# Patient Record
Sex: Female | Born: 1935 | Race: White | Hispanic: No | Marital: Married | Smoking: Never smoker
Health system: Southern US, Community
[De-identification: ages and names within clinical notes are randomized; demographics above are authoritative.]

## PROBLEM LIST (undated history)

## (undated) DIAGNOSIS — E039 Hypothyroidism, unspecified: Secondary | ICD-10-CM

## (undated) DIAGNOSIS — R112 Nausea with vomiting, unspecified: Secondary | ICD-10-CM

## (undated) DIAGNOSIS — E785 Hyperlipidemia, unspecified: Secondary | ICD-10-CM

## (undated) DIAGNOSIS — R351 Nocturia: Secondary | ICD-10-CM

## (undated) DIAGNOSIS — M1990 Unspecified osteoarthritis, unspecified site: Secondary | ICD-10-CM

## (undated) DIAGNOSIS — Z9889 Other specified postprocedural states: Secondary | ICD-10-CM

## (undated) DIAGNOSIS — D649 Anemia, unspecified: Secondary | ICD-10-CM

## (undated) DIAGNOSIS — I10 Essential (primary) hypertension: Secondary | ICD-10-CM

## (undated) DIAGNOSIS — G252 Other specified forms of tremor: Secondary | ICD-10-CM

## (undated) HISTORY — PX: TOTAL HIP ARTHROPLASTY: SHX124

## (undated) HISTORY — PX: COLONOSCOPY: SHX174

## (undated) HISTORY — PX: JOINT REPLACEMENT: SHX530

## (undated) HISTORY — PX: KNEE ARTHROSCOPY: SUR90

## (undated) HISTORY — PX: FRACTURE SURGERY: SHX138

---

## 1998-02-19 ENCOUNTER — Other Ambulatory Visit: Admission: RE | Admit: 1998-02-19 | Discharge: 1998-02-19 | Payer: Self-pay | Admitting: Family Medicine

## 1999-01-19 ENCOUNTER — Encounter: Payer: Self-pay | Admitting: Family Medicine

## 1999-01-19 ENCOUNTER — Ambulatory Visit (HOSPITAL_COMMUNITY): Admission: RE | Admit: 1999-01-19 | Discharge: 1999-01-19 | Payer: Self-pay | Admitting: Family Medicine

## 1999-02-26 ENCOUNTER — Other Ambulatory Visit: Admission: RE | Admit: 1999-02-26 | Discharge: 1999-02-26 | Payer: Self-pay | Admitting: Family Medicine

## 2000-06-06 ENCOUNTER — Other Ambulatory Visit: Admission: RE | Admit: 2000-06-06 | Discharge: 2000-06-06 | Payer: Self-pay | Admitting: Family Medicine

## 2000-10-12 ENCOUNTER — Encounter (INDEPENDENT_AMBULATORY_CARE_PROVIDER_SITE_OTHER): Payer: Self-pay

## 2000-10-12 ENCOUNTER — Ambulatory Visit (HOSPITAL_COMMUNITY): Admission: RE | Admit: 2000-10-12 | Discharge: 2000-10-12 | Payer: Self-pay

## 2001-08-22 ENCOUNTER — Other Ambulatory Visit: Admission: RE | Admit: 2001-08-22 | Discharge: 2001-08-22 | Payer: Self-pay | Admitting: Obstetrics and Gynecology

## 2001-10-05 ENCOUNTER — Encounter: Payer: Self-pay | Admitting: Emergency Medicine

## 2001-10-05 ENCOUNTER — Inpatient Hospital Stay (HOSPITAL_COMMUNITY): Admission: EM | Admit: 2001-10-05 | Discharge: 2001-10-09 | Payer: Self-pay | Admitting: Emergency Medicine

## 2001-10-06 ENCOUNTER — Encounter: Payer: Self-pay | Admitting: Orthopedic Surgery

## 2002-11-20 ENCOUNTER — Other Ambulatory Visit: Admission: RE | Admit: 2002-11-20 | Discharge: 2002-11-20 | Payer: Self-pay | Admitting: Obstetrics and Gynecology

## 2003-02-27 ENCOUNTER — Ambulatory Visit (HOSPITAL_COMMUNITY): Admission: RE | Admit: 2003-02-27 | Discharge: 2003-02-27 | Payer: Self-pay | Admitting: Family Medicine

## 2003-11-24 ENCOUNTER — Ambulatory Visit: Payer: Self-pay | Admitting: Family Medicine

## 2004-02-12 ENCOUNTER — Other Ambulatory Visit: Admission: RE | Admit: 2004-02-12 | Discharge: 2004-02-12 | Payer: Self-pay | Admitting: Obstetrics and Gynecology

## 2004-03-23 ENCOUNTER — Ambulatory Visit: Payer: Self-pay | Admitting: Family Medicine

## 2004-07-28 ENCOUNTER — Ambulatory Visit: Payer: Self-pay | Admitting: Family Medicine

## 2004-12-06 ENCOUNTER — Ambulatory Visit: Payer: Self-pay | Admitting: Family Medicine

## 2004-12-13 ENCOUNTER — Ambulatory Visit (HOSPITAL_COMMUNITY): Admission: RE | Admit: 2004-12-13 | Discharge: 2004-12-13 | Payer: Self-pay | Admitting: Orthopedic Surgery

## 2005-01-27 ENCOUNTER — Ambulatory Visit: Payer: Self-pay | Admitting: Family Medicine

## 2005-01-28 ENCOUNTER — Inpatient Hospital Stay (HOSPITAL_COMMUNITY): Admission: EM | Admit: 2005-01-28 | Discharge: 2005-02-01 | Payer: Self-pay | Admitting: *Deleted

## 2005-01-28 ENCOUNTER — Ambulatory Visit: Payer: Self-pay | Admitting: Internal Medicine

## 2005-01-29 ENCOUNTER — Ambulatory Visit: Payer: Self-pay | Admitting: Gastroenterology

## 2005-01-30 ENCOUNTER — Encounter (INDEPENDENT_AMBULATORY_CARE_PROVIDER_SITE_OTHER): Payer: Self-pay | Admitting: Specialist

## 2005-02-07 ENCOUNTER — Ambulatory Visit: Payer: Self-pay | Admitting: Family Medicine

## 2005-02-14 ENCOUNTER — Other Ambulatory Visit: Admission: RE | Admit: 2005-02-14 | Discharge: 2005-02-14 | Payer: Self-pay | Admitting: Obstetrics and Gynecology

## 2005-02-24 ENCOUNTER — Encounter: Admission: RE | Admit: 2005-02-24 | Discharge: 2005-02-24 | Payer: Self-pay | Admitting: Sports Medicine

## 2005-03-03 ENCOUNTER — Ambulatory Visit: Payer: Self-pay | Admitting: Family Medicine

## 2005-03-29 ENCOUNTER — Ambulatory Visit: Payer: Self-pay | Admitting: Family Medicine

## 2005-04-25 ENCOUNTER — Ambulatory Visit: Payer: Self-pay | Admitting: Family Medicine

## 2005-05-24 ENCOUNTER — Ambulatory Visit: Payer: Self-pay | Admitting: Family Medicine

## 2005-05-30 ENCOUNTER — Ambulatory Visit: Payer: Self-pay | Admitting: Family Medicine

## 2005-06-15 ENCOUNTER — Inpatient Hospital Stay (HOSPITAL_COMMUNITY): Admission: RE | Admit: 2005-06-15 | Discharge: 2005-06-18 | Payer: Self-pay | Admitting: Orthopedic Surgery

## 2005-07-11 ENCOUNTER — Encounter: Admission: RE | Admit: 2005-07-11 | Discharge: 2005-08-05 | Payer: Self-pay | Admitting: Orthopedic Surgery

## 2005-07-25 ENCOUNTER — Ambulatory Visit: Payer: Self-pay | Admitting: Family Medicine

## 2005-11-24 ENCOUNTER — Ambulatory Visit: Payer: Self-pay | Admitting: Family Medicine

## 2006-01-27 ENCOUNTER — Ambulatory Visit: Payer: Self-pay | Admitting: Family Medicine

## 2006-05-04 ENCOUNTER — Ambulatory Visit: Payer: Self-pay | Admitting: Family Medicine

## 2007-09-13 ENCOUNTER — Ambulatory Visit (HOSPITAL_COMMUNITY): Admission: RE | Admit: 2007-09-13 | Discharge: 2007-09-13 | Payer: Self-pay | Admitting: Ophthalmology

## 2007-10-04 ENCOUNTER — Ambulatory Visit (HOSPITAL_COMMUNITY): Admission: RE | Admit: 2007-10-04 | Discharge: 2007-10-04 | Payer: Self-pay | Admitting: Ophthalmology

## 2008-10-06 DIAGNOSIS — M810 Age-related osteoporosis without current pathological fracture: Secondary | ICD-10-CM | POA: Insufficient documentation

## 2008-10-06 DIAGNOSIS — K649 Unspecified hemorrhoids: Secondary | ICD-10-CM | POA: Insufficient documentation

## 2008-10-06 DIAGNOSIS — K5730 Diverticulosis of large intestine without perforation or abscess without bleeding: Secondary | ICD-10-CM | POA: Insufficient documentation

## 2008-10-06 DIAGNOSIS — I10 Essential (primary) hypertension: Secondary | ICD-10-CM

## 2008-10-06 DIAGNOSIS — E039 Hypothyroidism, unspecified: Secondary | ICD-10-CM

## 2008-10-06 DIAGNOSIS — Z8719 Personal history of other diseases of the digestive system: Secondary | ICD-10-CM

## 2008-10-06 DIAGNOSIS — R0789 Other chest pain: Secondary | ICD-10-CM

## 2008-10-06 DIAGNOSIS — Z9889 Other specified postprocedural states: Secondary | ICD-10-CM | POA: Insufficient documentation

## 2008-10-06 DIAGNOSIS — R0602 Shortness of breath: Secondary | ICD-10-CM

## 2008-10-06 DIAGNOSIS — E7800 Pure hypercholesterolemia, unspecified: Secondary | ICD-10-CM

## 2008-10-06 DIAGNOSIS — F329 Major depressive disorder, single episode, unspecified: Secondary | ICD-10-CM

## 2008-10-07 DIAGNOSIS — Z860100 Personal history of colon polyps, unspecified: Secondary | ICD-10-CM | POA: Insufficient documentation

## 2009-01-06 ENCOUNTER — Encounter: Payer: Self-pay | Admitting: Family Medicine

## 2009-01-06 ENCOUNTER — Ambulatory Visit (HOSPITAL_COMMUNITY): Admission: RE | Admit: 2009-01-06 | Discharge: 2009-01-06 | Payer: Self-pay | Admitting: Family Medicine

## 2009-01-28 ENCOUNTER — Ambulatory Visit (HOSPITAL_COMMUNITY): Admission: RE | Admit: 2009-01-28 | Discharge: 2009-01-28 | Payer: Self-pay | Admitting: Family Medicine

## 2010-01-27 ENCOUNTER — Encounter: Payer: Self-pay | Admitting: Gastroenterology

## 2010-02-07 ENCOUNTER — Encounter: Payer: Self-pay | Admitting: Family Medicine

## 2010-02-18 NOTE — Letter (Signed)
Summary: Colonoscopy Letter  [facility]  [address] [place], [place] [address]   Phone: [phone]  Fax: [phone]      January 27, 2010 MRN: [id number]   [facility] [address] [place], [place]  [address]   Dear Ms. Kaczorowski,   According to your medical record, it is time for you to schedule a Colonoscopy. The American Cancer Society recommends this procedure as a method to detect early colon cancer. Patients with a family history of colon cancer, or a personal history of colon polyps or inflammatory bowel disease are at increased risk.  This letter has been generated based on the recommendations made at the time of your procedure. If you feel that in your particular situation this may no longer apply, please contact our office.  Please call our office at [phone] to schedule this appointment or to update your records at your earliest convenience.  Thank you for cooperating with [place] to provide you with the very best care possible.   Sincerely,  Judie Petit T. Russella Dar, M.D.  [facility] Gastroenterology Division [phone]

## 2010-06-04 NOTE — Op Note (Signed)
NAME:  [name], [name]                   ACCOUNT NO.:  [id number]   MEDICAL RECORD NO.:  [id number]                   PATIENT TYPE:  INP   LOCATION:  2550                                 FACILITY:  [facility]   PHYSICIAN:  Kennieth Rad, M.D.              DATE OF BIRTH:  09-18-1933   DATE OF PROCEDURE:  10/05/2001  DATE OF DISCHARGE:                                 OPERATIVE REPORT   PREOPERATIVE DIAGNOSES:  Fractured left tibial shaft and fibula.  Also,  fractured distal tibia.   POSTOPERATIVE DIAGNOSES:  Fractured left tibial shaft and fibula.  Also,  fractured distal tibia.   ANESTHESIA:  General.   PROCEDURE PERFORMED:  Open reduction, internal fixation, with intramedullary  rodding, left tibia, and application of short-leg cast.   PROCEDURE:  The patient was taken to the operating room and given adequate  premedication and given general anesthesia and intubated.  The left lower  leg was prepped with Duraprep and draped in a sterile manner.  Tourniquet  and Bovie were used for hemostasis.  The C-arm was used to visualize guide  wire placement and reduction.  The patient had anterior midline incision  over the left patella through the skin and subcutaneous tissue.  A  paramedian incision was made into the capsule with the knee in the flexed  position.  An awl was used to initiate reaming down the tibial shaft,  followed by hand reamer.  A guide wire was placed down across the fracture  site to the distal tibia.  The incision was allowed with the use of the C-  arm.  Measurement was then taken, which was found to be 36 mm for the rod.  Reaming was done up to 10.5 mm.  A 10 mm x 36 mm rod was then placed down  across the fracture site, holding it in anatomic position.  After placement  of the rod, two crossfixing proximal screws were placed, and one distal  crossfixing screw was then placed.  This held the fracture site in a stable  position and anatomic position.  The  distal tibial fracture, the plafond,  was nondisplaced and would be treated with cast.  Copious irrigation with  antibiotic solution was done.  The capsule was closed with 0 Vicryl.  2-0  was used for the subcutaneous.  Skin staples were used for the skin.  A  compressive bulky dressing was applied, followed by a short leg cast.  The  patient tolerated the procedure well and went to the recovery room in stable  and satisfactory condition.                                               Kennieth Rad, M.D.    AFC/MEDQ  D:  10/06/2001  T:  10/08/2001  Job:  [id number]  

## 2010-06-04 NOTE — H&P (Signed)
   NAME:  [name], [name]                   ACCOUNT NO.:  [id number]   MEDICAL RECORD NO.:  [id number]                   PATIENT TYPE:  INP   LOCATION:  2550                                 FACILITY:  [facility]   PHYSICIAN:  Kennieth Rad, M.D.              DATE OF BIRTH:  12-10-1933   DATE OF ADMISSION:  10/05/2001  DATE OF DISCHARGE:                                HISTORY & PHYSICAL   CHIEF COMPLAINT:  Painful deformed left lower leg.   HISTORY OF PRESENT ILLNESS:  This is a 77 year old female who states that  she was out in her yard and slipped on a wet surface and lost her footing.  The patient states that her left lower leg twisted and she fell.  She had to  drag herself back to the house and upstairs.  The patient denies any other  injuries or loss of consciousness.  The patient was subsequently brought to  [facility] Emergency Room for treatment.   PAST MEDICAL HISTORY:  Hypothyroidism, hypercholesterolemia, dilatations and  curettages in the past.   ALLERGIES:  NONE KNOWN.   MEDICATIONS:  1. Synthroid 0.5 milligrams.  2. Metoprolol.  3. Fosamax.   HABITS:  None.   FAMILY HISTORY:  No history of high blood pressure or diabetes.   SOCIAL HISTORY:  The patient lives with her husband and her mother.  The  patient has two sons and one daughter.   PHYSICAL EXAMINATION:  VITAL SIGNS:  Temperature 99.4, pulse 66, respiration  16, blood pressure 136/66.  Height 5'8, weight 180.  HEAD, EYES, EARS, NOSE, THROAT:  Normocephalic.  Sclera clear.  NECK:  Supple.  CHEST:  Clear.  CARDIAC:  S1 and S2 regular.  EXTREMITIES:  Left lower leg swollen, slightly angulated.  Nail beds pink  and blanched.  Dorsalis pedis is palpable.   DIAGNOSTIC STUDIES:  X-rays reveal shallow mid-shaft tibial fracture,  oblique fibula fracture, and posterior tibial plafond fracture of left lower  leg.   IMPRESSION:  1. Tibia and fibula fracture mid-shaft.  2.     Posterior tibial and distal  tibial plafond fracture of left lower leg.  3. History of hypothyroidism.  4. Hypercholesterolemia.                                               Kennieth Rad, M.D.    AFC/MEDQ  D:  10/06/2001  T:  10/09/2001  Job:  [id number]

## 2010-06-04 NOTE — Discharge Summary (Signed)
NAME:  [name], [name] NO.:  [id number]   MEDICAL RECORD NO.:  [id number]          PATIENT TYPE:  INP   LOCATION:  5041                         FACILITY:  [facility]   PHYSICIAN:  Loreta Ave, M.D. DATE OF BIRTH:  1933/12/23   DATE OF ADMISSION:  06/15/2005  DATE OF DISCHARGE:  06/18/2005                                 DISCHARGE SUMMARY   FINAL DIAGNOSES:  1.  Status post left total hip replacement for end-stage degenerative joint      disease/avascular necrosis.  2.  Hypothyroidism.  3.  Hyperlipidemia.  4.  Esophageal reflux.   HISTORY OF PRESENT ILLNESS:  A 77 year old female with history of end-stage  DJD/AVN, left hip, with chronic pain, who presented to our office for a  preoperative evaluation.  She had progressive worsening pain without  response with conservative treatment.  Significant decrease in her daily  activities due to the ongoing complaint.   HOSPITAL COURSE:  On Jun 15, 2005, the patient was taken to the [facility]  OR and a left total hip replacement procedure was performed.  Surgeon Loreta Ave, M.D. and assistant Dimple Casey, P.A.C.  Anesthesia general.  No  specimens.  Estimated blood loss 300 mL.  There were no surgical or  anesthesia complications, and the patient was transferred to recovery in  stable condition.  Started on pharmacy protocol Coumadin postoperatively.  On Jun 16, 2005, the patient was doing well with good pain control.  Temperature 100.7, pulse 91, respirations 16, blood pressure 111/65.  Hemoglobin 10.5.  Sodium 137, potassium 3.9, chloride 108, CO2 of 25, BUN 9,  creatinine 0.9, glucose 151.  Evaluated by PT.  On June 17, 2005, the patient  was doing well with good pain control.  Temperature 100.9, pulse 102,  respirations 20, blood pressure 145/71.  Hemoglobin 10.4, glucose 159, INR  1.5.  The wound looked good, staples intact.  No signs of infection.  Calf  nontender.  Neurovascularly intact.  The patient  continues to progress well  with therapy.  Discontinued PCA and Foley, O2.  Heplocked IV.  On June 18, 2005, the patient was doing very well.  Good hall ambulation.  Ready to go  home.  Vital signs stable, afebrile.  Hemoglobin 10.6, glucose 156, INR of  1.8.  The wound looked good, staples intact.  No signs of infection.  Neurovascularly intact distally.  No bowel movement yet.  She was given a  Dulcolax suppository.   DISCHARGE MEDICATIONS:  1.  Percocet 5/325, 1-2 tablets p.o. q.4-6h. p.r.n. for pain.  2.  Robaxin 500 mg one tablet p.o. q.6h. p.r.n. for spasms.  3.  Coumadin x4 weeks postoperatively for DVT prophylaxis.  4.  Resume previous home medications.   CONDITION:  Good and stable.   DISPOSITION:  Discharged home.   INSTRUCTIONS:  The patient will work with home health PT and OT.  Home  health R.N. to monitor Coumadin dose and PT INRs x4 weeks postoperatively.  Dressing changes p.r.n.  Call the office 2 weeks postoperatively for  recheck.  Call sooner for any problems or concerns.  ______________________________  Dimple Casey, P.A.C.      Loreta Ave, M.D.  Electronically Signed    JM/MEDQ  D:  07/06/2005  T:  07/06/2005  Job:  [id number]

## 2010-06-04 NOTE — Op Note (Signed)
[facility]  Patient:    [name], [name] Visit Number: [id number] MRN: [id number]          Service Type: DSU Location: [facility] Attending Physician:  Sharon Mt Dictated by:   Rande Brunt. Eda Paschal, M.D. Proc. Date: 10/12/00 Admit Date:  10/12/2000                             Operative Report  PREOPERATIVE DIAGNOSIS:       Postmenopausal bleeding with abnormal sonohystogram.  POSTOPERATIVE DIAGNOSIS:      Postmenopausal bleeding with abnormal sonohystogram.  OPERATION:                    Hysteroscopy, dilation and curettage.  SURGEON:                      Daniel L. Eda Paschal, M.D.  ANESTHESIA:                   IV sedation and paracervical block.  INDICATIONS:                  The patient is a 77 year old gravida 3, para 3, AB0 who presented to the office in August with a history of postmenopausal bleeding on Prempro.  She stopped the Prempro.  Sonohystogram was done in the office.  It revealed a normal cavity in terms of endometrial stripe with normal ovaries but, on sonohystogram, there was a significant irregularity of the posterior wall of the uterus noted.  As a result of this, she enters the hospital for hysteroscopy, endometrial sampling and excision of whatever the irregularity is.  FINDINGS:                     External vaginal exam was within normal limits. The cervix was clean. The uterus was midposition, normal size and shape with first-degree  uterine descensus.  Adnexa were not palpable.  At the time of hysteroscopy, the patient had an irregular buildup of endometrium on the posterior wall.  It certainly could have been compatible with a small polyp of about 0.75 cm, but it also could have been just an irregular buildup of endometrium from her Prempro.  The top of the fundus, tugal ostia, anterior and posterior walls of the fundus and lower uterine segment were normal except for this endometrial buildup on the  posterior wall in the lower uterine segment.  The endocervical canal was also normal.  DESCRIPTION OF PROCEDURE:     The patient was taken to the operating room, placed in the dorsal lithotomy position, prepped and draped in the usual sterile manner.  She was given IV sedation by anesthesia and a 20 cc 1% plain paracervical block by Dr. Eda Paschal.  Anesthesia was excellent.  The cervix was dilated to #25 [facility] dilator.  Hysteroscopic examination revealed the above findings.  Using several-sized endometrial curettings, adequate sampling of the endometrium was taken including trying to remove the lesion described above.  It was felt that the lesion was completely excised based on what was removed.  The patient was then rehysteroscoped and, indeed, the lesion was gone.  Pictures were taken for documentation.  Estimated blood loss for the entire procedure was less than 50 cc with none replaced.  Fluid deficit was less than 100 cc.  The patient left the operating room in satisfactory condition. Dictated by:   Rande Brunt. Eda Paschal, M.D. Attending  Physician:  Sharon Mt DD:  10/12/00 TD:  10/12/00 Job: [phone] [id number]

## 2010-06-04 NOTE — Discharge Summary (Signed)
   NAME:  [name], [name]                   ACCOUNT NO.:  [id number]   MEDICAL RECORD NO.:  [id number]                   PATIENT TYPE:  INP   LOCATION:  5002                                 FACILITY:  [facility]   PHYSICIAN:  Kennieth Rad, M.D.              DATE OF BIRTH:  Oct 03, 1933   DATE OF ADMISSION:  10/05/2001  DATE OF DISCHARGE:  10/09/2001                                 DISCHARGE SUMMARY   ADMISSION DIAGNOSES:  1. Fractured left tibia and fibula.  2. History of hypothyroidism.   DISCHARGE DIAGNOSES:  1. Fractured left tibia and fibula.  2. History of hypothyroidism.   COMPLICATIONS:  None.   INFECTIONS:  None.   OPERATION PERFORMED:  Open reduction internal fixation with intramedullary  rodding, left tibia.   HISTORY OF PRESENT ILLNESS:  The patient is a 77 year old female who had  sustained a fall on a wet surface in her yard.  The patient was brought to  the [facility] emergency department for treatment for injury to the left  lower extremity.  Pertinent physical was of that of the left lower  extremity.  Extremity tender, swollen, some malrotation.  Dorsalis pedis and  posterior tibials were intact.  Sensory was intact.  X-rays of the old  fractured tibia and fibula shaft.   HOSPITAL COURSE:  Patient had preop laboratory done which was found to be  stable for the patient to undergo surgery.  The patient underwent open  reduction internal fixation with intramedullary rodding of the left tibia  and application of short leg cast.  Tolerated procedure quite well.   Postop course.  Pain control with Dilaudid, ice packs, elevation.  Start on  physical therapy, nonweightbearing on the left side after the patient's pain  is brought under control and sustained with the use of oral Percocet.  The  patient progressed with therapy quite well.  The patient was placed on  Coumadin and will be kept on it for a three week period until she increases  her activity.  The  patient was unable to be discharged, continued on  Coumadin for three weeks.  Home help physical therapy with INR checks,  return to the office in two weeks, nonweightbearing on the left side.  Patient discharged in stable and satisfactory condition.                                               Kennieth Rad, M.D.    AFC/MEDQ  D:  10/24/2001  T:  10/24/2001  Job:  [id number]

## 2010-06-04 NOTE — Op Note (Signed)
NAME:  [name], [name] NO.:  [id number]   MEDICAL RECORD NO.:  [id number]          PATIENT TYPE:  INP   LOCATION:  5041                         FACILITY:  [facility]   PHYSICIAN:  Loreta Ave, M.D. DATE OF BIRTH:  1933/12/05   DATE OF PROCEDURE:  06/14/2005  DATE OF DISCHARGE:                                 OPERATIVE REPORT   PREOPERATIVE DIAGNOSIS:  End-stage degenerative arthritis, left hip.   POSTOPERATIVE DIAGNOSIS:  End-stage degenerative arthritis, left hip.   OPERATION/PROCEDURE:  Left total hip replacement.  [employer]  prosthesis.  54 mm TSL acetabular cup screw fixation x2 and a 10-degree  polyethylene insert with X3 poly, 36 mm internal diameter, 10-degree  overhang.  Cemented #7 femoral component, EON-PLUS with 127-degree neck  angle.  Pressurized cement technique with a #3 cement restricter.  An 11 mm  centralizer.  A 36 mm plus 0 metallic femoral head.   SURGEON:  Loreta Ave, M.D.   ASSISTANT:  Genene Churn. Owens, P.A.-C. without was there throughout the case  and assisted throughout the case.   ANESTHESIA:  General.   ESTIMATED BLOOD LOSS:  300 mL.   BLOOD REPLACED:  None.   SPECIMENS:  Excised bone and soft tissue.   CULTURES:  None.   COMPLICATIONS:  None.   DRESSING:  Soft compressive and abduction pillow.   DESCRIPTION OF PROCEDURE:  The patient was brought to the operating room and  after adequate anesthesia had been obtained, turned to a lateral position on  the operating room with appropriate padding and support.  Left side up. Leg  length is assessed.  She is actually just a scant amount longer on the left  despite the degree of degenerative change.  Incision along the shaft of the  femur extending posterior and superior.  Skin and subcutaneous tissue  divided.  Iliotibial band exposed, incised, Charnley retractor put in place.  Neurovascular structures identified and protected.  External rotator capsule  taken down  off the back of the intertrochanteric groove of the femur and  tagged with FiberWire.  The hip dislocated posteriorly.  Grade 4 changes  throughout.  Femoral head removed one fingerbreadth above the lesser  trochanter.  Acetabulum exposed.  Very thick and redundant labrum.  Hypertrophic synovial tissue was all removed.  Sequential reaming up to good  bleeding bone approximate medial and inferior placement and sized for a 54  mm component.  The component was hammered in place at 45 degrees of  abduction, 20 degrees of anteversion.  Good capturing of the excision.  Augmented with two screws through the cup, predrilled, tapped with a 16 mm  screw in the back, 20 mm above.  A 10-degree polyethylene inserted, 36 mm  internal diameter was then placed with the overhang placed posterosuperior.  All retractors removed.  Attention turned to the femur.  Handheld power  reamer was proximal and distal.  Sized for a #7 component.  After  appropriate trialing, I had good restoration of leg length, excellent  stability in flexion and extension with the #7 component.  A 35 mm neck, 127-  degree neck angle and  a +0 36 mm head.  All trials were removed.  Cement  restricter placed distally in the femur.  Copious irrigation of the pulse  irrigating device.  Cement was prepared down into the canal of the femur for  the pressurized cement technique.  The #7 component had the 11 mm  centralizer attached, firmly placed down in the femur, restoring normal  femoral anteversion.  All excessive cement removed.  Once the cement  hardened, the 36 mm +0 head was attached.  The hip reduced.  Again,  excellent stability and flexion and extension.  Good restoration of leg  lengths.  Wound irrigated.  External rotating capsule repaired to the back  of the intertrochanteric grooves through drill holes and a FiberWire was  tied over a bony bridge.  Charnley retractor removed.  Iliotibial band  closed with #1 Vicryl.  Skin and  subcutaneous tissue with Vicryl stable.  Margins of the wound injected with Marcaine.  Sterile compressive dressings  applied.  Return to supine position.  Abduction pillow placed.  Anesthesia  reversed. Brought to the recovery room.  Tolerated surgery well.  No  complications.      Loreta Ave, M.D.  Electronically Signed     DFM/MEDQ  D:  06/15/2005  T:  06/15/2005  Job:  [id number]

## 2010-06-04 NOTE — Op Note (Signed)
   NAME:  [name], [name]                   ACCOUNT NO.:  [id number]   MEDICAL RECORD NO.:  [id number]                   PATIENT TYPE:  INP   LOCATION:  5002                                 FACILITY:  [facility]   PHYSICIAN:  Myrtie Neither, MD                   DATE OF BIRTH:  12/26/1933   DATE OF PROCEDURE:  10/04/2001  DATE OF DISCHARGE:  10/09/2001                                 OPERATIVE REPORT   PREOPERATIVE DIAGNOSES:  Fractured left tibia and fibula shaft.   POSTOPERATIVE DIAGNOSES:  Fractured left tibia and fibula shaft.   OPERATION PERFORMED:  Open reduction internal fixation with intramedullary  rod, left tibia.  Application of short leg cast.   SURGEON:  Kennieth Rad, M.D.   ANESTHESIA:  General.   DESCRIPTION OF PROCEDURE:  The patient was taken to the operating room and  after given adequate preop medications, given general anesthesia and  intubated.  The right lower extremity was prepped with DuraPrep and draped  in a sterile manner.  Tourniquet was used for hemostasis.  A midline  incision was made over the patella, going through the skin and subcutaneous  tissue.  A small medial parapatellar incision was made down to the tibial  tubercle.  An awl was used to introduce and open into the proximal tibial  area followed by a hand reamer.  Guidewire was then slid down across the  tibia proximal and distal end, across the fracture site followed by limited  reaming.  Guidewire was switched to a complete smooth guidewire.  Measurement for the rod was done.  Synthes IM nail was used after  appropriate sizing.  The nail was driven down over the guidewire transfixing  the fracture site in anatomic position.  Two proximal, one distal screws  were used to prevent rotation.  Copious irrigation was done, visualization  and reduction by way of C-arm.  Wound closure then done with 0 Vicryl for  the fascia, capsule, 2-0 for the subcutaneous and skin staples for the skin.  Compressive dressing was applied followed by a short leg cast.  Patient  tolerated the procedure quite well and went to the recovery room in stable  and satisfactory condition.                                                 Myrtie Neither, MD    AC/MEDQ  D:  10/24/2001  T:  10/24/2001  Job:  [id number]

## 2010-06-04 NOTE — Discharge Summary (Signed)
[name], [name]         ACCOUNT NO.:  [id number]   MEDICAL RECORD NO.:  [id number]          PATIENT TYPE:  INP   LOCATION:  3704                         FACILITY:  [facility]   PHYSICIAN:  Duncan Dull, M.D.     DATE OF BIRTH:  1933/10/06   DATE OF ADMISSION:  01/28/2005  DATE OF DISCHARGE:  02/01/2005                                 DISCHARGE SUMMARY   ADMISSION DIAGNOSES:  1.  GI Bleed due to duodenal and colonic ulcers.  2.  Iron deficientcy anemia secondary to #1.  3.  Dyslipidemia.  4.  Hypertension.  5.  Hypothyroidism.  6.  History of constipation.  7.  Osteoporosis with total hip replacement planned for Spring 2007.  8.  Left tibia and fibula fracture status post open reduction and internal      fixation, intramedullary rodding of left tibia September 2003.   DISCHARGE MEDICATIONS:  1.  Zocor 40 mg p.o. daily.  2.  Synthroid 100 mcg p.o. daily.  3.  Protonix 40 mg p.o. daily.  4.  Ferrous gluconate 325 mg p.o. twice daily  5.  Senna 2 to 4 tablets daily as needed for constipation.  6.  Darvocet-N 100, 650/100 one tablet q.4h. as needed for pain.  7.  Gemfibrozil 600 mg twice daily p.o.   DISPOSITION:  The patient is to follow up with Dr. Lysbeth Galas, her primary care  physician, on February 07, 2005, at 10:45.  She is also to report to Dr.  Joyce Copa office on February 04, 2005, for CBC.  The patient is also to follow  up with [facility] in 4 to 6 weeks to check a TSH and free T4.   PROCEDURES:  1.  January 28, 2005, chest x-ray showed no acute findings.  2.  January 30, 2005, colonoscopy with biopsy showed 5 mm ulcer in the      ascending colon, 15 mm solitary ulcer in the cecum.  Biopsy taken.      Normal hepatic flexure to the descending colon.  Diverticulosis in the      descending colon at one time.  Scattered diverticula and normal sigmoid      colon to rectum.  3.  January 29, 2005, endoscopy showed normal duodenum to duodenal second      portion, normal proximal  esophagus, and antrum maximum size 6 mm, not      bleeding, clear ulcer.   CONSULTATIONS:  GI: Barbette Hair. Arlyce Dice, M.D.   HISTORY AND PHYSICAL:  A 77 year old white female with a history of  hypothyroidism, dyslipidemia, and hypertension presents in ED with 58-month  history of worsening fatigue.  The patient endorses shortness of breath  going up stairs for the past week and fatigue.  The patient presented to Dr.  Joyce Copa office yesterday for evaluation and was gound to have a hemoglobin  4.8, hemoccult negative.  The patient was advised to come to the ED.  The  patient denies nausea, vomiting, diarrhea, blood in stool, abdominal pain.  Colonoscopy done in 2004, per patient and 2 positive nodes.  The patient  denies any history of trauma, and the patient has been  feeling bad for the  past 3 months for her hip.  In the ED, the patient received Lasix 40 mg x1  and 2 units of packed red blood cells.   PHYSICAL EXAMINATION:  VITAL SIGNS:  Temperature 97.3, blood pressure  146/78, pulse 103, respiratory rate 16, O2 saturation 100% on room air.  GENERAL:  Patient lying in bed in no acute distress, pleasant.  EYES: Pupils equal, round, and reactive to light.  Extraocular muscles  intact.  Conjunctivae pale. No icterus.  ENT: Oropharynx clear.  NECK:  Supple, no adenopathy.  RESPIRATORY: Clear to auscultation bilaterally.  CARDIOVASCULAR: Tachycardic, regular rhythm, no murmur or gallops.  ABDOMEN:  Soft, nontender, distended.  Positive bowel sounds.  No  splenomegaly.  EXTREMITIES:  Bilateral lower extremity edema.  GU: FOBT negative.  SKIN:  Warm and dry with edema in the lower extremities.  NEUROLOGIC: Nonfocal.  Cranial nerves II-XII grossly intact, 5/5 strength in  the upper and lower extremities and appropriate.   LABORATORY DATA:  Sodium 140, potassium 3.6, chloride 111, bicarb 23, BUN  10, creatinine 0.9, glucose 102, bilirubin 0.5, alkaline phosphatase 39 ,  AST 29, ALT 26,  protein 5.7, albumin 3.3, calcium 8.3.  PT 14.1, INR 1.1,  and PTT 22.  White count 6.6, hemoglobin __________ , RDW 18.  Reticulocyte  count 2.7.  Absolute reticulocyte count 37.8.  Total bilirubin 1.2.  LDH  113.  UA was clear, yellow, with specific gravity of 1.011, pH of 8,  positive for nitrites, glucose, bilirubin, protein, positive for  urobilinogen 0.2.   HOSPITAL COURSE:  #1.  MICROCYTIC ANEMIA:  Secondary to upper GI bleed and lower GI bleed  secondary to NSAID-related ulcers diagnosed prior endoscopy and colonoscopy  on January 30, 2005.  The patient's hemoglobin responded to 4 units packed  red blood cells, rising from 4.7 to 10.1.  No further blood loss was noted.  EGD/colonscopy revealed clean based duodenal and colonic ulcers.  No  biopsies were done. per GI due tp their appearance consistent with NSAID-  induced etiology.  The patient was maintained and discharged on ferrous  gluconate 325 mg twice daily.  Stool guaiac remained negative throughout  course.  The patient was to follow up CBC on February 04, 2005, with Dr.  Lysbeth Galas. GI was consulted.  Per GI, the patient should avoid using sulindac  or any other NSAID.  The patient was discharged on Protonix 40 mg p.o.  daily.   #2.  HYPERTENSION:  The patient's blood pressure was moderately controlled  during hospital course with systolic blood pressure ranging from 128 to 146  over 74 to 88.  The patient's home regimen of verapamil is not restarted on  patient's initial presentation.  The patient was follow up with her primary  care doctor and discuss restarting verapamil.   #3.  DYSLIPIDEMIA: The patient was continued on her home regimen of Zocor 40  mg daily.  Fasting lipid panel was checked.   #4.  HYPOTHYROIDISM: The patient's TSH was elevated at 7.273, and the  patient's Synthroid was increased from 75 mcg to 100 mcg daily.  The patient  is to recheck her thyroid function in 4 to 6 weeks.  #5.  CONSTIPATION:  The  patient was well controlled with Senokot 2 to 4  tablets p.o. daily during the hospital course.   DISCHARGE LABORATORY DATA:  WBC 7.8, hemoglobin 10.1, hematocrit 31.4, MCV  72.9, platelet count 315.  Sodium 139, potassium 3.6, chloride 111, CO2  23,  glucose 101, BUN 7, creatinine 0.8, calcium 8.7.     ______________________________  Cloyd Stagers, M.D.  Electronically Signed    SB/MEDQ  D:  02/01/2005  T:  02/01/2005  Job:  [id number]

## 2010-07-02 IMAGING — US US ABDOMEN COMPLETE
1 series · 14 of 25 positions shown · non-contrast
Comparison: None.

CLINICAL DATA: Abdominal distention.  History of GI bleed.

COMPLETE ABDOMINAL ULTRASOUND

[Series 1: unknown · 0.30mm/px · 14 of 61 slices shown]
[im 1/61]
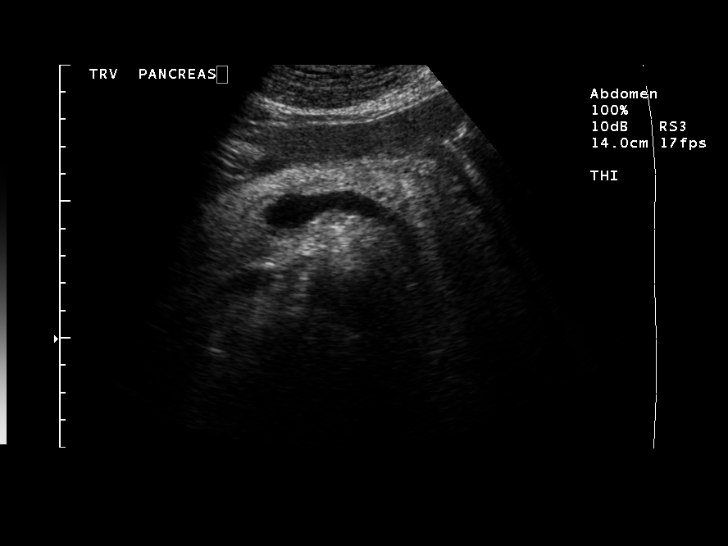
[im 6/61]
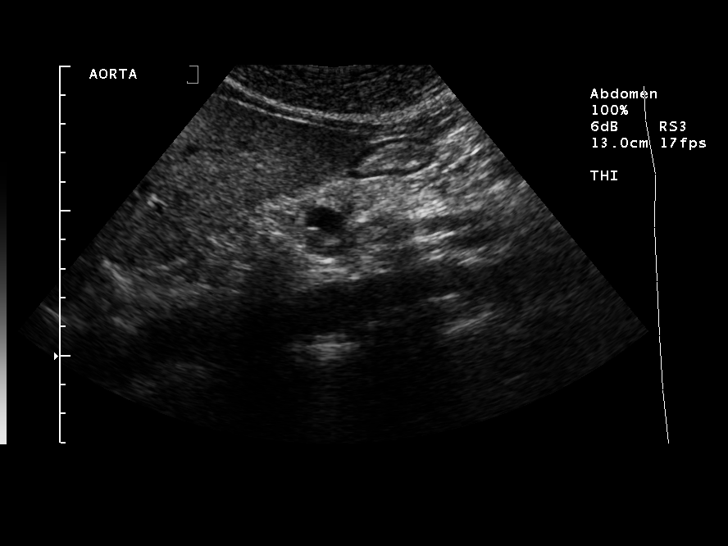
[im 11/61]
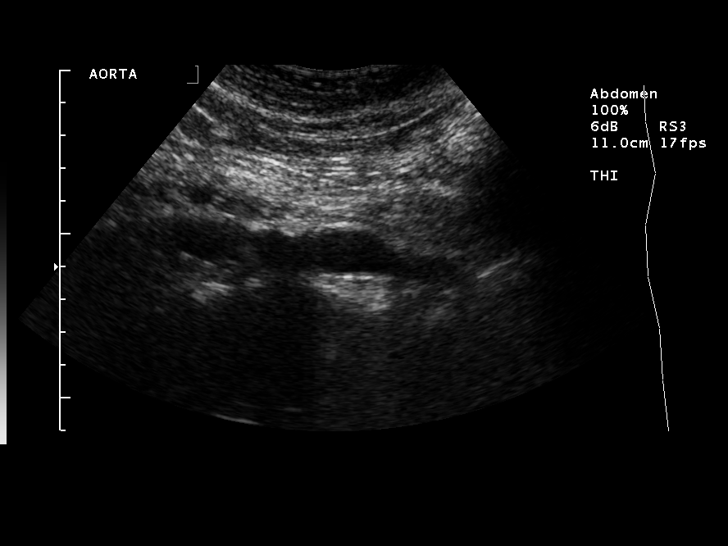
[im 16/61]
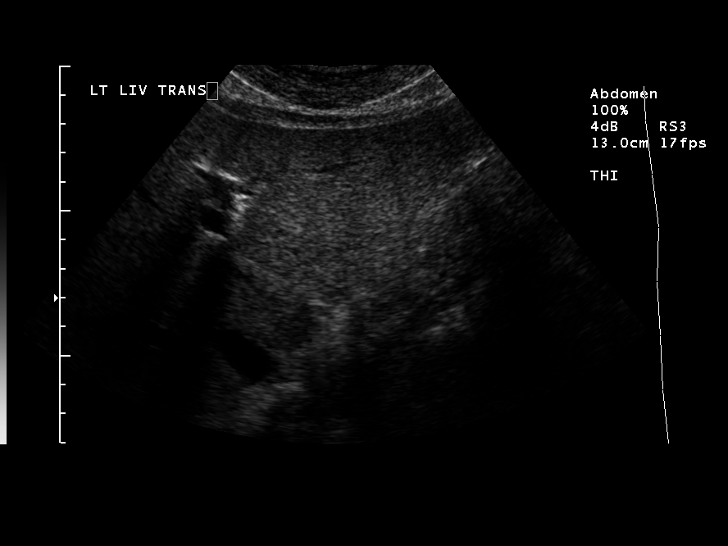
[im 21/61]
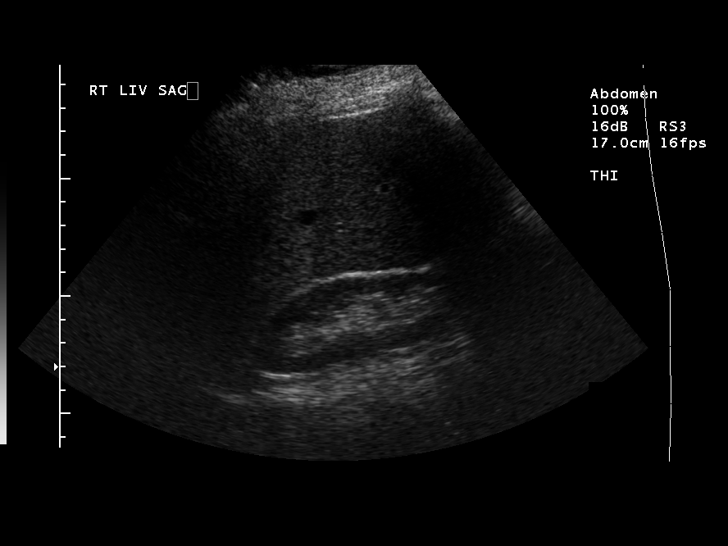
[im 23/61]
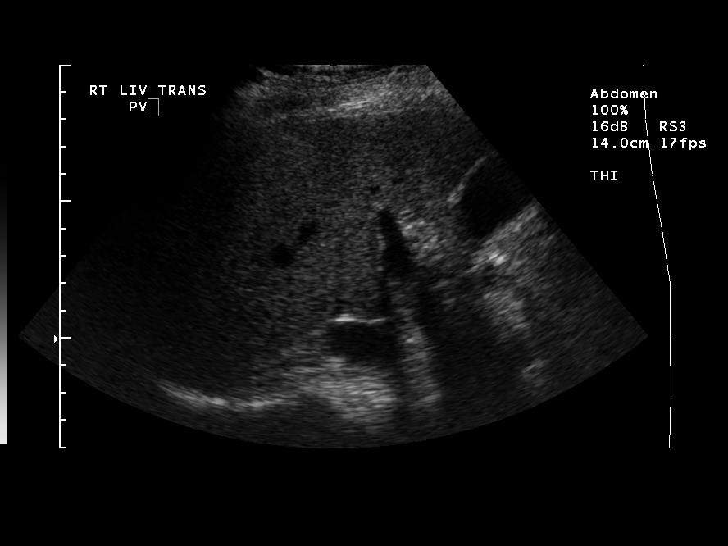
[im 28/61]
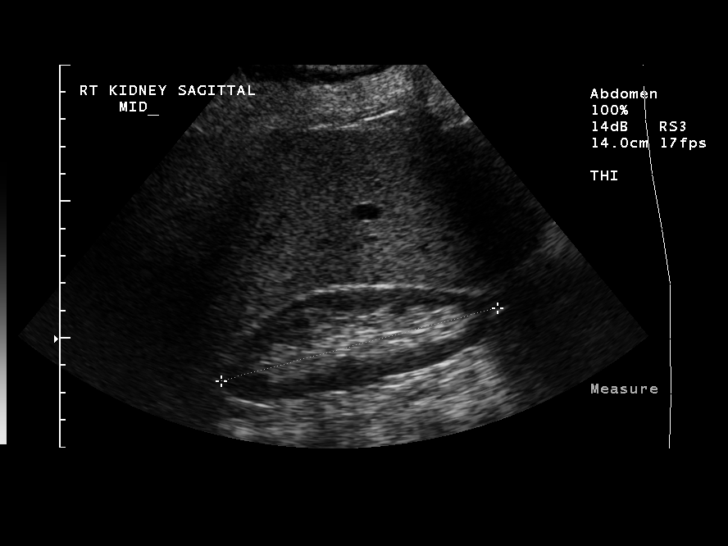
[im 33/61]
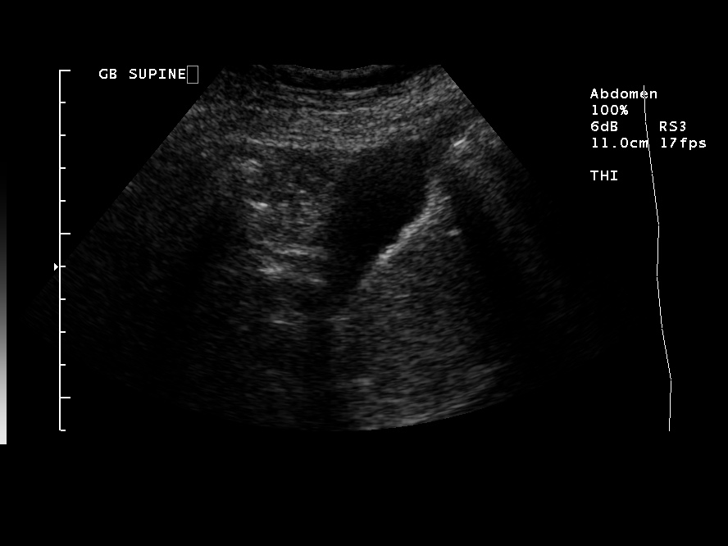
[im 38/61]
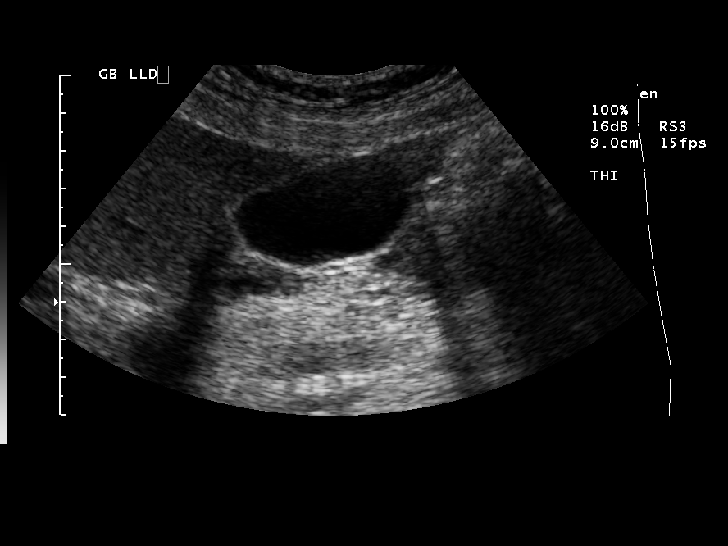
[im 41/61]
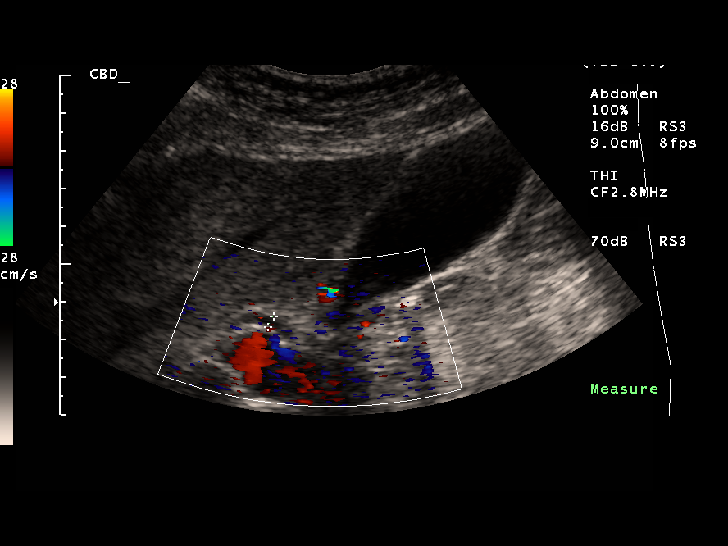
[im 46/61]
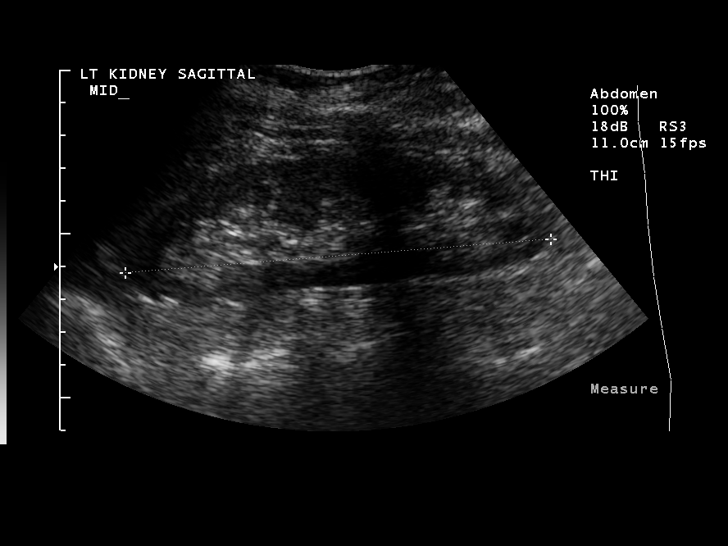
[im 51/61]
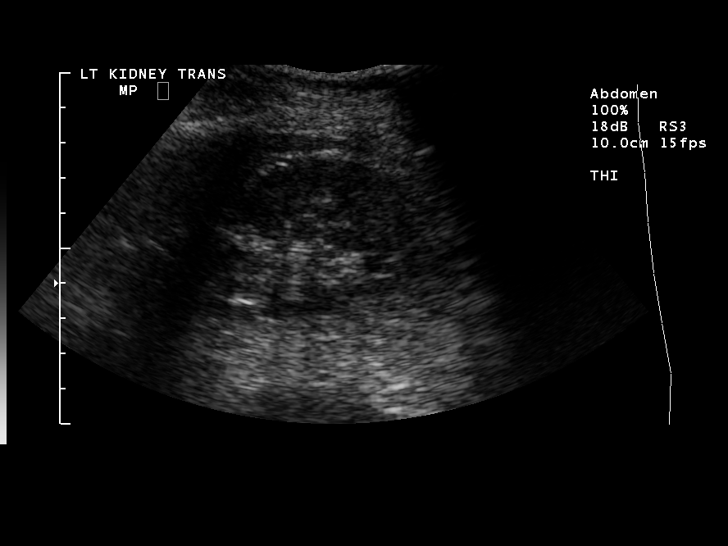
[im 56/61]
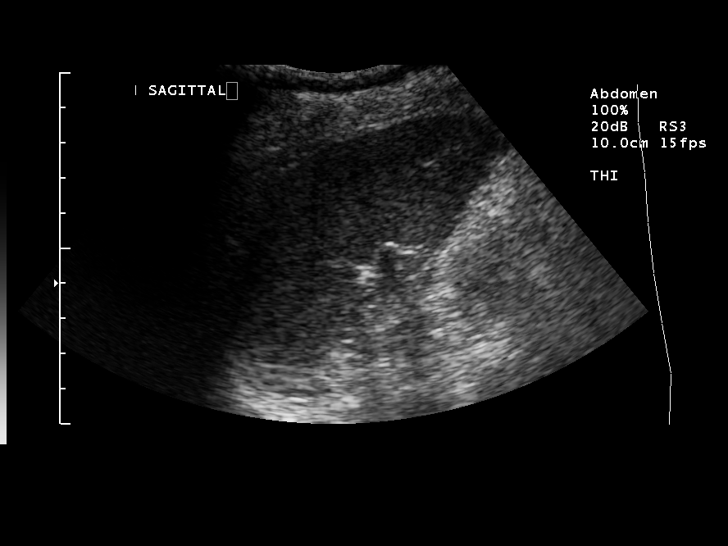
[im 61/61]
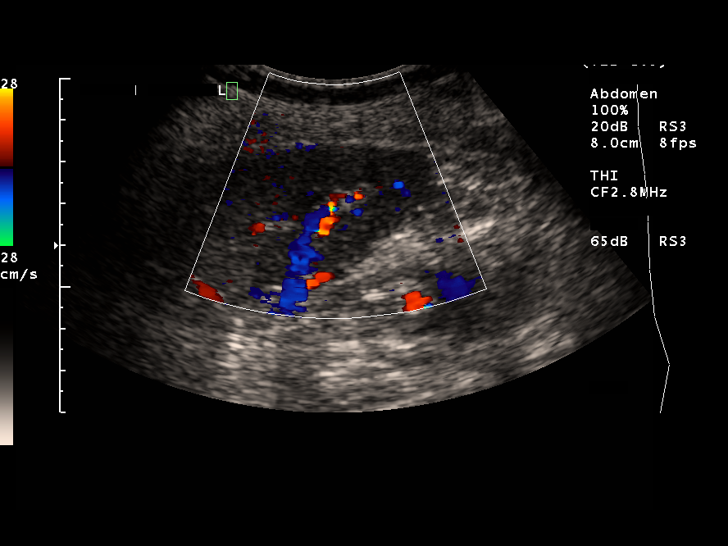

[14 of 25 positions shown; findings below may reference images not displayed]

FINDINGS: Gallbladder:  No gallstones, gallbladder wall thickening, or
pericholecystic fluid. Negative Murphy's sign.

Common bile duct:  Measures 3.2 mm in diameter.

Liver:  No focal lesion identified.  Within normal limits in
parenchymal echogenicity.

IVC:  Appears normal.

Pancreas:  No focal abnormality seen.

Spleen:  Measures 6.6 cm in length.

Right Kidney:  Measures 10.4 cm in length.

Left Kidney:  Measures 13.0 cm in length.

Abdominal aorta:  No aneurysm identified. 1.8 cm maximum diameter
of the aorta.
IMPRESSION: Negative gallbladder.  No biliary ductal dilatation.  Discrepancy
in renal size.  Left kidney measures approximate 2.6 cm more in
length than the right kidney.  The etiology for this is uncertain.

## 2010-07-22 ENCOUNTER — Ambulatory Visit (HOSPITAL_COMMUNITY)
Admission: RE | Admit: 2010-07-22 | Discharge: 2010-07-22 | Disposition: A | Discharge status: Home / Self Care | Payer: Medicare Other | Source: Ambulatory Visit | Attending: Family Medicine | Admitting: Family Medicine

## 2010-07-22 ENCOUNTER — Other Ambulatory Visit (HOSPITAL_COMMUNITY): Payer: Self-pay | Admitting: Family Medicine

## 2010-07-22 DIAGNOSIS — M169 Osteoarthritis of hip, unspecified: Secondary | ICD-10-CM | POA: Insufficient documentation

## 2010-07-22 DIAGNOSIS — M51379 Other intervertebral disc degeneration, lumbosacral region without mention of lumbar back pain or lower extremity pain: Secondary | ICD-10-CM | POA: Insufficient documentation

## 2010-07-22 DIAGNOSIS — M5137 Other intervertebral disc degeneration, lumbosacral region: Secondary | ICD-10-CM | POA: Insufficient documentation

## 2010-07-22 DIAGNOSIS — M545 Low back pain, unspecified: Secondary | ICD-10-CM | POA: Insufficient documentation

## 2010-07-22 DIAGNOSIS — M1610 Unilateral primary osteoarthritis, unspecified hip: Secondary | ICD-10-CM | POA: Insufficient documentation

## 2010-07-22 DIAGNOSIS — M25559 Pain in unspecified hip: Secondary | ICD-10-CM | POA: Insufficient documentation

## 2010-09-13 ENCOUNTER — Other Ambulatory Visit (HOSPITAL_COMMUNITY): Payer: Self-pay | Admitting: Neurosurgery

## 2010-09-13 DIAGNOSIS — M545 Low back pain: Secondary | ICD-10-CM

## 2010-09-13 DIAGNOSIS — IMO0002 Reserved for concepts with insufficient information to code with codable children: Secondary | ICD-10-CM

## 2010-09-13 DIAGNOSIS — M47817 Spondylosis without myelopathy or radiculopathy, lumbosacral region: Secondary | ICD-10-CM

## 2010-09-14 ENCOUNTER — Ambulatory Visit (HOSPITAL_COMMUNITY)
Admission: RE | Admit: 2010-09-14 | Discharge: 2010-09-14 | Disposition: A | Discharge status: Home / Self Care | Payer: Medicare Other | Source: Ambulatory Visit | Attending: Neurosurgery | Admitting: Neurosurgery

## 2010-09-14 DIAGNOSIS — M545 Low back pain, unspecified: Secondary | ICD-10-CM | POA: Insufficient documentation

## 2010-09-14 DIAGNOSIS — M5126 Other intervertebral disc displacement, lumbar region: Secondary | ICD-10-CM | POA: Insufficient documentation

## 2010-09-14 DIAGNOSIS — M47817 Spondylosis without myelopathy or radiculopathy, lumbosacral region: Secondary | ICD-10-CM

## 2010-09-14 DIAGNOSIS — IMO0002 Reserved for concepts with insufficient information to code with codable children: Secondary | ICD-10-CM

## 2011-01-18 DIAGNOSIS — Z9289 Personal history of other medical treatment: Secondary | ICD-10-CM

## 2011-01-18 HISTORY — DX: Personal history of other medical treatment: Z92.89

## 2011-10-03 ENCOUNTER — Encounter: Payer: Self-pay | Admitting: Gastroenterology

## 2012-03-15 ENCOUNTER — Other Ambulatory Visit (HOSPITAL_COMMUNITY): Payer: Self-pay | Admitting: Anesthesiology

## 2012-03-15 DIAGNOSIS — M545 Low back pain: Secondary | ICD-10-CM

## 2012-03-15 DIAGNOSIS — IMO0002 Reserved for concepts with insufficient information to code with codable children: Secondary | ICD-10-CM

## 2012-03-20 ENCOUNTER — Encounter (HOSPITAL_COMMUNITY): Payer: Self-pay

## 2012-03-20 ENCOUNTER — Ambulatory Visit (HOSPITAL_COMMUNITY)
Admission: RE | Admit: 2012-03-20 | Discharge: 2012-03-20 | Disposition: A | Discharge status: Home / Self Care | Payer: Medicare Other | Source: Ambulatory Visit | Attending: Anesthesiology | Admitting: Anesthesiology

## 2012-03-20 DIAGNOSIS — M545 Low back pain, unspecified: Secondary | ICD-10-CM | POA: Insufficient documentation

## 2012-03-20 DIAGNOSIS — IMO0002 Reserved for concepts with insufficient information to code with codable children: Secondary | ICD-10-CM

## 2012-03-20 DIAGNOSIS — M79609 Pain in unspecified limb: Secondary | ICD-10-CM | POA: Insufficient documentation

## 2012-03-20 DIAGNOSIS — M5126 Other intervertebral disc displacement, lumbar region: Secondary | ICD-10-CM | POA: Insufficient documentation

## 2012-03-29 ENCOUNTER — Other Ambulatory Visit: Payer: Self-pay | Admitting: Neurosurgery

## 2012-03-29 ENCOUNTER — Encounter (HOSPITAL_COMMUNITY): Payer: Self-pay | Admitting: Pharmacy Technician

## 2012-04-05 ENCOUNTER — Encounter (HOSPITAL_COMMUNITY)
Admission: RE | Admit: 2012-04-05 | Discharge: 2012-04-05 | Disposition: A | Discharge status: Home / Self Care | Payer: Medicare Other | Source: Ambulatory Visit | Attending: Neurosurgery | Admitting: Neurosurgery

## 2012-04-05 ENCOUNTER — Ambulatory Visit (HOSPITAL_COMMUNITY)
Admission: RE | Admit: 2012-04-05 | Discharge: 2012-04-05 | Disposition: A | Discharge status: Home / Self Care | Payer: Medicare Other | Source: Ambulatory Visit | Attending: Anesthesiology | Admitting: Anesthesiology

## 2012-04-05 ENCOUNTER — Encounter (HOSPITAL_COMMUNITY): Payer: Self-pay

## 2012-04-05 DIAGNOSIS — Z01812 Encounter for preprocedural laboratory examination: Secondary | ICD-10-CM | POA: Insufficient documentation

## 2012-04-05 DIAGNOSIS — Z01818 Encounter for other preprocedural examination: Secondary | ICD-10-CM | POA: Insufficient documentation

## 2012-04-05 DIAGNOSIS — J4489 Other specified chronic obstructive pulmonary disease: Secondary | ICD-10-CM | POA: Insufficient documentation

## 2012-04-05 DIAGNOSIS — J449 Chronic obstructive pulmonary disease, unspecified: Secondary | ICD-10-CM | POA: Insufficient documentation

## 2012-04-05 HISTORY — DX: Unspecified osteoarthritis, unspecified site: M19.90

## 2012-04-05 HISTORY — DX: Essential (primary) hypertension: I10

## 2012-04-05 HISTORY — DX: Nocturia: R35.1

## 2012-04-05 HISTORY — DX: Hyperlipidemia, unspecified: E78.5

## 2012-04-05 LAB — BASIC METABOLIC PANEL
CO2: 28 mEq/L (ref 19–32)
Calcium: 10.3 mg/dL (ref 8.4–10.5)
Creatinine, Ser: 1.05 mg/dL (ref 0.50–1.10)
Glucose, Bld: 96 mg/dL (ref 70–99)

## 2012-04-05 LAB — CBC
Hemoglobin: 15.1 g/dL — ABNORMAL HIGH (ref 12.0–15.0)
MCH: 32.5 pg (ref 26.0–34.0)
MCV: 93.1 fL (ref 78.0–100.0)
RBC: 4.65 MIL/uL (ref 3.87–5.11)

## 2012-04-05 NOTE — Pre-Procedure Instructions (Signed)
[name]  04/05/2012   Your procedure is scheduled on: Tuesday, March 25th.  Report to [facility] at 8:30 AM.  Call this number if you have problems the morning of surgery: [phone]   Remember:   Do not eat food or drink liquids after midnight.   Take these medicines the morning of surgery with A SIP OF WATER: Levothyroxine (Synthyroid).  May take Hydrocodone- Acetaminophen (Vicodin) if needed.  Stop taking Aspirin, Coumadin, Plavix, Effient and Herbal medications.  Do not take any NSAIDs ie: Celebrex, Ibuprofen,  Advil,Naproxen or any medication containing Aspirin.   Do not wear jewelry, make-up or nail polish.  Do not wear lotions, powders, or perfumes. You may wear deodorant.  Do not shave 48 hours prior to surgery.  Do not bring valuables to the hospital.  Contacts, dentures or bridgework may not be worn into surgery.  Leave suitcase in the car. After surgery it may be brought to your room.  For patients admitted to the hospital, checkout time is 11:00 AM the day of discharge.   Patients discharged the day of surgery will not be allowed to drive home.  Name and phone number of your driver: -   Special Instructions: Shower using CHG 2 nights before surgery and the night before surgery.  If you shower the day of surgery use CHG.  Use special wash - you have one bottle of CHG for all showers.  You should use approximately 1/3 of the bottle for each shower.   Please read over the following fact sheets that you were given: Pain Booklet, Coughing and Deep Breathing and Surgical Site Infection Prevention

## 2012-04-09 MED ORDER — CEFAZOLIN SODIUM-DEXTROSE 2-3 GM-% IV SOLR
2.0000 g | INTRAVENOUS | Status: AC
  Administered 2012-04-10: 2 g via INTRAVENOUS
  Filled 2012-04-09: qty 50

## 2012-04-10 ENCOUNTER — Ambulatory Visit (HOSPITAL_COMMUNITY): Payer: Medicare Other

## 2012-04-10 ENCOUNTER — Encounter (HOSPITAL_COMMUNITY): Payer: Self-pay | Admitting: Anesthesiology

## 2012-04-10 ENCOUNTER — Ambulatory Visit (HOSPITAL_COMMUNITY)
Admission: RE | Admit: 2012-04-10 | Discharge: 2012-04-11 | Disposition: A | Discharge status: Home / Self Care | Payer: Medicare Other | Source: Ambulatory Visit | Attending: Neurosurgery | Admitting: Neurosurgery

## 2012-04-10 ENCOUNTER — Encounter (HOSPITAL_COMMUNITY): Payer: Self-pay | Admitting: *Deleted

## 2012-04-10 ENCOUNTER — Encounter (HOSPITAL_COMMUNITY)
Admission: RE | Disposition: A | Discharge status: Home / Self Care | Payer: Self-pay | Source: Ambulatory Visit | Attending: Neurosurgery

## 2012-04-10 ENCOUNTER — Ambulatory Visit (HOSPITAL_COMMUNITY): Payer: Medicare Other | Admitting: Anesthesiology

## 2012-04-10 DIAGNOSIS — M47817 Spondylosis without myelopathy or radiculopathy, lumbosacral region: Secondary | ICD-10-CM | POA: Insufficient documentation

## 2012-04-10 DIAGNOSIS — E039 Hypothyroidism, unspecified: Secondary | ICD-10-CM | POA: Insufficient documentation

## 2012-04-10 DIAGNOSIS — E7800 Pure hypercholesterolemia, unspecified: Secondary | ICD-10-CM | POA: Insufficient documentation

## 2012-04-10 DIAGNOSIS — M51379 Other intervertebral disc degeneration, lumbosacral region without mention of lumbar back pain or lower extremity pain: Secondary | ICD-10-CM | POA: Insufficient documentation

## 2012-04-10 DIAGNOSIS — I10 Essential (primary) hypertension: Secondary | ICD-10-CM | POA: Insufficient documentation

## 2012-04-10 DIAGNOSIS — M5126 Other intervertebral disc displacement, lumbar region: Secondary | ICD-10-CM | POA: Insufficient documentation

## 2012-04-10 DIAGNOSIS — M129 Arthropathy, unspecified: Secondary | ICD-10-CM | POA: Insufficient documentation

## 2012-04-10 DIAGNOSIS — M5137 Other intervertebral disc degeneration, lumbosacral region: Secondary | ICD-10-CM | POA: Insufficient documentation

## 2012-04-10 HISTORY — PX: LUMBAR LAMINECTOMY/DECOMPRESSION MICRODISCECTOMY: SHX5026

## 2012-04-10 SURGERY — LUMBAR LAMINECTOMY/DECOMPRESSION MICRODISCECTOMY 1 LEVEL
Anesthesia: General | Site: Back | Laterality: Right | Wound class: Clean

## 2012-04-10 MED ORDER — PANTOPRAZOLE SODIUM 40 MG IV SOLR
40.0000 mg | Freq: Every day | INTRAVENOUS | Status: DC
  Filled 2012-04-10 (×2): qty 40

## 2012-04-10 MED ORDER — ZOLPIDEM TARTRATE 5 MG PO TABS: 5.0000 mg | ORAL_TABLET | Freq: Every evening | ORAL | Status: DC | PRN

## 2012-04-10 MED ORDER — PHENOL 1.4 % MT LIQD: 1.0000 | OROMUCOSAL | Status: DC | PRN

## 2012-04-10 MED ORDER — 0.9 % SODIUM CHLORIDE (POUR BTL) OPTIME
TOPICAL | Status: DC | PRN
  Administered 2012-04-10: 1000 mL

## 2012-04-10 MED ORDER — HYDROCODONE-ACETAMINOPHEN 5-325 MG PO TABS: 1.0000 | ORAL_TABLET | Freq: Four times a day (QID) | ORAL | Status: DC | PRN

## 2012-04-10 MED ORDER — CEFAZOLIN SODIUM 1-5 GM-% IV SOLN
1.0000 g | Freq: Three times a day (TID) | INTRAVENOUS | Status: AC
  Administered 2012-04-10 – 2012-04-11 (×2): 1 g via INTRAVENOUS
  Filled 2012-04-10 (×2): qty 50

## 2012-04-10 MED ORDER — DOCUSATE SODIUM 100 MG PO CAPS
100.0000 mg | ORAL_CAPSULE | Freq: Two times a day (BID) | ORAL | Status: DC
  Administered 2012-04-10: 100 mg via ORAL

## 2012-04-10 MED ORDER — FLEET ENEMA 7-19 GM/118ML RE ENEM
1.0000 | ENEMA | Freq: Once | RECTAL | Status: AC | PRN
  Filled 2012-04-10: qty 1

## 2012-04-10 MED ORDER — LEVOTHYROXINE SODIUM 125 MCG PO TABS
125.0000 ug | ORAL_TABLET | Freq: Every day | ORAL | Status: DC
  Administered 2012-04-11: 125 ug via ORAL
  Filled 2012-04-10 (×2): qty 1

## 2012-04-10 MED ORDER — ONDANSETRON HCL 4 MG/2ML IJ SOLN: 4.0000 mg | INTRAMUSCULAR | Status: DC | PRN

## 2012-04-10 MED ORDER — DOCUSATE SODIUM 100 MG PO CAPS
100.0000 mg | ORAL_CAPSULE | Freq: Two times a day (BID) | ORAL | Status: DC
  Filled 2012-04-10: qty 1

## 2012-04-10 MED ORDER — FENTANYL CITRATE 0.05 MG/ML IJ SOLN
INTRAMUSCULAR | Status: DC | PRN
  Administered 2012-04-10 (×2): 50 ug via INTRAVENOUS

## 2012-04-10 MED ORDER — BACITRACIN 50000 UNITS IM SOLR
INTRAMUSCULAR | Status: AC
  Filled 2012-04-10: qty 1

## 2012-04-10 MED ORDER — LACTATED RINGERS IV SOLN
INTRAVENOUS | Status: DC | PRN
  Administered 2012-04-10: 11:00:00 via INTRAVENOUS

## 2012-04-10 MED ORDER — THROMBIN 5000 UNITS EX SOLR
CUTANEOUS | Status: DC | PRN
  Administered 2012-04-10 (×2): 5000 [IU] via TOPICAL

## 2012-04-10 MED ORDER — ACETAMINOPHEN 325 MG PO TABS: 650.0000 mg | ORAL_TABLET | ORAL | Status: DC | PRN

## 2012-04-10 MED ORDER — ROCURONIUM BROMIDE 100 MG/10ML IV SOLN
INTRAVENOUS | Status: DC | PRN
  Administered 2012-04-10: 50 mg via INTRAVENOUS

## 2012-04-10 MED ORDER — MORPHINE SULFATE 2 MG/ML IJ SOLN: 1.0000 mg | INTRAMUSCULAR | Status: DC | PRN

## 2012-04-10 MED ORDER — LIDOCAINE HCL (CARDIAC) 20 MG/ML IV SOLN
INTRAVENOUS | Status: DC | PRN
  Administered 2012-04-10: 100 mg via INTRAVENOUS
  Administered 2012-04-10: 50 mg via INTRAVENOUS

## 2012-04-10 MED ORDER — OXYCODONE HCL 5 MG/5ML PO SOLN
5.0000 mg | Freq: Once | ORAL | Status: DC | PRN
Start: 2012-04-10 — End: 2012-04-10

## 2012-04-10 MED ORDER — BUPIVACAINE HCL (PF) 0.5 % IJ SOLN
INTRAMUSCULAR | Status: DC | PRN
  Administered 2012-04-10: 10 mL

## 2012-04-10 MED ORDER — CELECOXIB 200 MG PO CAPS
200.0000 mg | ORAL_CAPSULE | Freq: Every day | ORAL | Status: DC
  Administered 2012-04-10: 200 mg via ORAL
  Filled 2012-04-10 (×2): qty 1

## 2012-04-10 MED ORDER — NEOSTIGMINE METHYLSULFATE 1 MG/ML IJ SOLN
INTRAMUSCULAR | Status: DC | PRN
  Administered 2012-04-10: 2 mg via INTRAVENOUS

## 2012-04-10 MED ORDER — LIDOCAINE-EPINEPHRINE 1 %-1:100000 IJ SOLN
INTRAMUSCULAR | Status: DC | PRN
  Administered 2012-04-10: 10 mL

## 2012-04-10 MED ORDER — ACETAMINOPHEN 650 MG RE SUPP: 650.0000 mg | RECTAL | Status: DC | PRN

## 2012-04-10 MED ORDER — SODIUM CHLORIDE 0.9 % IV SOLN: 250.0000 mL | INTRAVENOUS | Status: DC

## 2012-04-10 MED ORDER — FENTANYL CITRATE 0.05 MG/ML IJ SOLN
INTRAMUSCULAR | Status: AC
  Filled 2012-04-10: qty 2

## 2012-04-10 MED ORDER — ARTIFICIAL TEARS OP OINT
TOPICAL_OINTMENT | OPHTHALMIC | Status: DC | PRN
  Administered 2012-04-10: 1 via OPHTHALMIC

## 2012-04-10 MED ORDER — GLYCOPYRROLATE 0.2 MG/ML IJ SOLN
INTRAMUSCULAR | Status: DC | PRN
  Administered 2012-04-10: .4 mg via INTRAVENOUS

## 2012-04-10 MED ORDER — MIDAZOLAM HCL 5 MG/5ML IJ SOLN
INTRAMUSCULAR | Status: DC | PRN
  Administered 2012-04-10: 2 mg via INTRAVENOUS

## 2012-04-10 MED ORDER — KCL IN DEXTROSE-NACL 20-5-0.45 MEQ/L-%-% IV SOLN
INTRAVENOUS | Status: DC
  Filled 2012-04-10 (×3): qty 1000

## 2012-04-10 MED ORDER — DIAZEPAM 5 MG PO TABS: 5.0000 mg | ORAL_TABLET | Freq: Four times a day (QID) | ORAL | Status: DC | PRN

## 2012-04-10 MED ORDER — HYDROCHLOROTHIAZIDE 25 MG PO TABS
25.0000 mg | ORAL_TABLET | Freq: Every day | ORAL | Status: DC
  Administered 2012-04-10: 25 mg via ORAL
  Filled 2012-04-10 (×2): qty 1

## 2012-04-10 MED ORDER — HYDROCODONE-ACETAMINOPHEN 5-325 MG PO TABS: 1.0000 | ORAL_TABLET | ORAL | Status: DC | PRN

## 2012-04-10 MED ORDER — MENTHOL 3 MG MT LOZG: 1.0000 | LOZENGE | OROMUCOSAL | Status: DC | PRN

## 2012-04-10 MED ORDER — SODIUM CHLORIDE 0.9 % IV SOLN
INTRAVENOUS | Status: AC
  Filled 2012-04-10: qty 500

## 2012-04-10 MED ORDER — TIZANIDINE HCL 2 MG PO TABS
2.0000 mg | ORAL_TABLET | Freq: Four times a day (QID) | ORAL | Status: DC | PRN
  Filled 2012-04-10: qty 1

## 2012-04-10 MED ORDER — ONDANSETRON HCL 4 MG/2ML IJ SOLN
INTRAMUSCULAR | Status: DC | PRN
  Administered 2012-04-10: 4 mg via INTRAVENOUS

## 2012-04-10 MED ORDER — METHYLPREDNISOLONE ACETATE 80 MG/ML IJ SUSP
INTRAMUSCULAR | Status: DC | PRN
  Administered 2012-04-10: 80 mg

## 2012-04-10 MED ORDER — ACETAMINOPHEN 10 MG/ML IV SOLN: 1000.0000 mg | Freq: Once | INTRAVENOUS | Status: DC | PRN

## 2012-04-10 MED ORDER — HEMOSTATIC AGENTS (NO CHARGE) OPTIME
TOPICAL | Status: DC | PRN
  Administered 2012-04-10: 1 via TOPICAL

## 2012-04-10 MED ORDER — ATORVASTATIN CALCIUM 20 MG PO TABS
20.0000 mg | ORAL_TABLET | Freq: Every day | ORAL | Status: DC
  Filled 2012-04-10 (×2): qty 1

## 2012-04-10 MED ORDER — OXYCODONE HCL 5 MG PO TABS: 5.0000 mg | ORAL_TABLET | Freq: Once | ORAL | Status: DC | PRN

## 2012-04-10 MED ORDER — ALUM & MAG HYDROXIDE-SIMETH 200-200-20 MG/5ML PO SUSP: 30.0000 mL | Freq: Four times a day (QID) | ORAL | Status: DC | PRN

## 2012-04-10 MED ORDER — OXYCODONE-ACETAMINOPHEN 5-325 MG PO TABS: 1.0000 | ORAL_TABLET | ORAL | Status: DC | PRN

## 2012-04-10 MED ORDER — SENNA 8.6 MG PO TABS
1.0000 | ORAL_TABLET | Freq: Two times a day (BID) | ORAL | Status: DC
  Administered 2012-04-10: 8.6 mg via ORAL
  Filled 2012-04-10 (×3): qty 1

## 2012-04-10 MED ORDER — SODIUM CHLORIDE 0.9 % IJ SOLN: 3.0000 mL | INTRAMUSCULAR | Status: DC | PRN

## 2012-04-10 MED ORDER — BISACODYL 10 MG RE SUPP: 10.0000 mg | Freq: Every day | RECTAL | Status: DC | PRN

## 2012-04-10 MED ORDER — SODIUM CHLORIDE 0.9 % IJ SOLN
3.0000 mL | Freq: Two times a day (BID) | INTRAMUSCULAR | Status: DC
  Administered 2012-04-10: 3 mL via INTRAVENOUS

## 2012-04-10 MED ORDER — PROMETHAZINE HCL 25 MG/ML IJ SOLN: 6.2500 mg | INTRAMUSCULAR | Status: DC | PRN

## 2012-04-10 MED ORDER — PROPOFOL 10 MG/ML IV BOLUS
INTRAVENOUS | Status: DC | PRN
  Administered 2012-04-10: 150 mg via INTRAVENOUS

## 2012-04-10 MED ORDER — HYDROMORPHONE HCL PF 1 MG/ML IJ SOLN: 0.2500 mg | INTRAMUSCULAR | Status: DC | PRN

## 2012-04-10 MED ORDER — FENTANYL CITRATE 0.05 MG/ML IJ SOLN
INTRAMUSCULAR | Status: DC | PRN
  Administered 2012-04-10: 100 ug via INTRAVENOUS

## 2012-04-10 MED ORDER — POLYETHYLENE GLYCOL 3350 17 G PO PACK
17.0000 g | PACK | Freq: Every day | ORAL | Status: DC | PRN
  Filled 2012-04-10: qty 1

## 2012-04-10 MED ORDER — MEPERIDINE HCL 25 MG/ML IJ SOLN: 6.2500 mg | INTRAMUSCULAR | Status: DC | PRN

## 2012-04-10 SURGICAL SUPPLY — 62 items
BAG DECANTER FOR FLEXI CONT (MISCELLANEOUS) ×2 IMPLANT
BENZOIN TINCTURE PRP APPL 2/3 (GAUZE/BANDAGES/DRESSINGS) IMPLANT
BIT DRILL NEURO 2X3.1 SFT TUCH (MISCELLANEOUS) ×1 IMPLANT
BLADE SURG ROTATE 9660 (MISCELLANEOUS) IMPLANT
BUR ROUND FLUTED 5 RND (BURR) ×2 IMPLANT
CANISTER SUCTION 2500CC (MISCELLANEOUS) ×2 IMPLANT
CLOTH BEACON ORANGE TIMEOUT ST (SAFETY) ×2 IMPLANT
CONT SPEC 4OZ CLIKSEAL STRL BL (MISCELLANEOUS) ×2 IMPLANT
DERMABOND ADHESIVE PROPEN (GAUZE/BANDAGES/DRESSINGS) ×1
DERMABOND ADVANCED (GAUZE/BANDAGES/DRESSINGS) ×1
DERMABOND ADVANCED .7 DNX12 (GAUZE/BANDAGES/DRESSINGS) ×1 IMPLANT
DERMABOND ADVANCED .7 DNX6 (GAUZE/BANDAGES/DRESSINGS) ×1 IMPLANT
DRAPE LAPAROTOMY 100X72X124 (DRAPES) ×2 IMPLANT
DRAPE MICROSCOPE LEICA (MISCELLANEOUS) ×2 IMPLANT
DRAPE POUCH INSTRU U-SHP 10X18 (DRAPES) ×2 IMPLANT
DRAPE SURG 17X23 STRL (DRAPES) ×2 IMPLANT
DRESSING TELFA 8X3 (GAUZE/BANDAGES/DRESSINGS) IMPLANT
DRILL NEURO 2X3.1 SOFT TOUCH (MISCELLANEOUS) ×2
DURAPREP 26ML APPLICATOR (WOUND CARE) ×2 IMPLANT
ELECT REM PT RETURN 9FT ADLT (ELECTROSURGICAL) ×2
ELECTRODE REM PT RTRN 9FT ADLT (ELECTROSURGICAL) ×1 IMPLANT
GAUZE SPONGE 4X4 16PLY XRAY LF (GAUZE/BANDAGES/DRESSINGS) IMPLANT
GLOVE BIO SURGEON STRL SZ8 (GLOVE) ×4 IMPLANT
GLOVE BIOGEL PI IND STRL 7.0 (GLOVE) ×2 IMPLANT
GLOVE BIOGEL PI IND STRL 7.5 (GLOVE) ×1 IMPLANT
GLOVE BIOGEL PI IND STRL 8 (GLOVE) ×1 IMPLANT
GLOVE BIOGEL PI IND STRL 8.5 (GLOVE) ×1 IMPLANT
GLOVE BIOGEL PI INDICATOR 7.0 (GLOVE) ×2
GLOVE BIOGEL PI INDICATOR 7.5 (GLOVE) ×1
GLOVE BIOGEL PI INDICATOR 8 (GLOVE) ×1
GLOVE BIOGEL PI INDICATOR 8.5 (GLOVE) ×1
GLOVE ECLIPSE 7.5 STRL STRAW (GLOVE) ×4 IMPLANT
GLOVE ECLIPSE 8.0 STRL XLNG CF (GLOVE) ×2 IMPLANT
GLOVE EXAM NITRILE LRG STRL (GLOVE) IMPLANT
GLOVE EXAM NITRILE MD LF STRL (GLOVE) ×2 IMPLANT
GLOVE EXAM NITRILE XL STR (GLOVE) IMPLANT
GLOVE EXAM NITRILE XS STR PU (GLOVE) IMPLANT
GLOVE SURG SS PI 7.0 STRL IVOR (GLOVE) ×6 IMPLANT
GOWN BRE IMP SLV AUR LG STRL (GOWN DISPOSABLE) IMPLANT
GOWN BRE IMP SLV AUR XL STRL (GOWN DISPOSABLE) ×8 IMPLANT
GOWN STRL REIN 2XL LVL4 (GOWN DISPOSABLE) ×2 IMPLANT
KIT BASIN OR (CUSTOM PROCEDURE TRAY) ×2 IMPLANT
KIT ROOM TURNOVER OR (KITS) ×2 IMPLANT
NEEDLE HYPO 18GX1.5 BLUNT FILL (NEEDLE) IMPLANT
NEEDLE HYPO 25X1 1.5 SAFETY (NEEDLE) ×2 IMPLANT
NEEDLE SPNL 20GX3.5 QUINCKE YW (NEEDLE) ×2 IMPLANT
NS IRRIG 1000ML POUR BTL (IV SOLUTION) ×2 IMPLANT
PACK LAMINECTOMY NEURO (CUSTOM PROCEDURE TRAY) ×2 IMPLANT
PAD ARMBOARD 7.5X6 YLW CONV (MISCELLANEOUS) ×6 IMPLANT
RUBBERBAND STERILE (MISCELLANEOUS) ×4 IMPLANT
SPONGE GAUZE 4X4 12PLY (GAUZE/BANDAGES/DRESSINGS) IMPLANT
SPONGE SURGIFOAM ABS GEL SZ50 (HEMOSTASIS) ×2 IMPLANT
STRIP CLOSURE SKIN 1/2X4 (GAUZE/BANDAGES/DRESSINGS) IMPLANT
SUT VIC AB 0 CT1 18XCR BRD8 (SUTURE) ×1 IMPLANT
SUT VIC AB 0 CT1 8-18 (SUTURE) ×1
SUT VIC AB 2-0 CT1 18 (SUTURE) ×4 IMPLANT
SUT VIC AB 3-0 SH 8-18 (SUTURE) ×2 IMPLANT
SYR 20ML ECCENTRIC (SYRINGE) ×2 IMPLANT
SYR 5ML LL (SYRINGE) IMPLANT
TOWEL OR 17X24 6PK STRL BLUE (TOWEL DISPOSABLE) ×2 IMPLANT
TOWEL OR 17X26 10 PK STRL BLUE (TOWEL DISPOSABLE) ×2 IMPLANT
WATER STERILE IRR 1000ML POUR (IV SOLUTION) ×2 IMPLANT

## 2012-04-10 NOTE — Progress Notes (Signed)
Awake, alert, conversant.  Decreased leg pain.  MAEW.

## 2012-04-10 NOTE — Anesthesia Preprocedure Evaluation (Addendum)
Anesthesia Evaluation  Patient identified by MRN, date of birth, ID band Patient awake    Reviewed: Allergy & Precautions, H&P , NPO status , Patient's Chart, lab work & pertinent test results  Airway       Dental  (+) Dental Advisory Given   Pulmonary shortness of breath and with exertion,          Cardiovascular hypertension, Pt. on medications     Neuro/Psych PSYCHIATRIC DISORDERS Depression negative neurological ROS     GI/Hepatic negative GI ROS, Neg liver ROS,   Endo/Other  Hypothyroidism   Renal/GU negative Renal ROS     Musculoskeletal negative musculoskeletal ROS (+)   Abdominal   Peds  Hematology negative hematology ROS (+)   Anesthesia Other Findings   Reproductive/Obstetrics                          Anesthesia Physical Anesthesia Plan  ASA: II  Anesthesia Plan: General   Post-op Pain Management:    Induction: Intravenous  Airway Management Planned: Oral ETT  Additional Equipment:   Intra-op Plan:   Post-operative Plan: Extubation in OR  Informed Consent: I have reviewed the patients History and Physical, chart, labs and discussed the procedure including the risks, benefits and alternatives for the proposed anesthesia with the patient or authorized representative who has indicated his/her understanding and acceptance.   Dental advisory given  Plan Discussed with: CRNA  Anesthesia Plan Comments:         Anesthesia Quick Evaluation

## 2012-04-10 NOTE — Preoperative (Signed)
Beta Blockers   Reason not to administer Beta Blockers:Not Applicable 

## 2012-04-10 NOTE — Op Note (Signed)
04/10/2012  1:28 PM  PATIENT:  [name]  79 y.o. female  PRE-OPERATIVE DIAGNOSIS:  Lumbar hnp without myelopathy, Lumbar spondylosis, Lumbar degenerative disc disease, Lumbar radiculopathy L 34 Right  POST-OPERATIVE DIAGNOSIS: Lumbar hnp without myelopathy, Lumbar spondylosis, Lumbar degenerative disc disease, Lumbar radiculopathy L 34 Right  PROCEDURE:  Procedure(s) with comments: LUMBAR LAMINECTOMY/DECOMPRESSION MICRODISCECTOMY 1 LEVEL (Right) - Right Lumbar three - four Microdiskectomy with microdissection  SURGEON:  Surgeon(s) and Role:    * Maeola Harman, MD - Primary    * Barnett Abu, MD - Assisting  PHYSICIAN ASSISTANT:   ASSISTANTS: Poteat, RN   ANESTHESIA:   general  EBL:     BLOOD ADMINISTERED:none  DRAINS: none   LOCAL MEDICATIONS USED:  LIDOCAINE   SPECIMEN:  No Specimen  DISPOSITION OF SPECIMEN:  N/A  COUNTS:  YES  TOURNIQUET:  * No tourniquets in log *  DICTATION: DICTATION: Patient has a large L34 disc rupture on the right with significant right leg weakness. It was elected to take her to surgery for right L34 microdiscectomy.  Procedure: Patient was brought to the operating room and following the smooth and uncomplicated induction of general endotracheal anesthesia she was placed in a prone position on the Wilson frame. Low back was prepped and draped in the usual sterile fashion with betadine and DuraPrep. A pre-op localizing Xray was obtained.  Area of planned incision was infiltrated with local lidocaine. Incision was made in the midline and carried to the lumbodorsal fascia which was incised on the right side of midline. Subperiosteal dissection was performed exposing what was felt to be L34 level. Intraoperative x-ray demonstrated marker probes at L 34. A hemi-semi-laminectomy of L3 was performed a high-speed drill and completed with Kerrison rongeurs and a generous foraminotomy was performed overlying the superior aspect of the L4 lamina.  Ligamentum flavum was detached and removed in a piecemeal fashion and the L4 nerve root was decompressed laterally with removal of the superior aspect of the facet and ligamentum causing nerve root compression. The microscope was brought into the field and the L4 nerve root was mobilized medially. This exposed a large amount of soft disc material and a free fragment of herniated disc material. Multiple fragments were removed.  The disc was adherent to the underside of the L 3 nerve root and this herniated disc material was removed with persistent micro-dissection and with a variety of ball hooks. The disc herniation did not extend into the interspace and consequently this was not operated.  At this point it was felt that all neural elements were well decompressed and there was no evidence of residual loose disc material. Thewound was then irrigated with saline and no additional disc material was mobilized. Hemostasis was assured with bipolar electrocautery and the laminectomy defectwas irrigated with Depo-Medrol and fentanyl. The lumbodorsal fascia was closed with 0 Vicryl sutures the subcutaneous tissues reapproximated 2-0 Vicryl inverted sutures and the skin edges were reapproximated with 3-0 Vicryl subcuticular stitch. The wound is dressed with Dermabond. Patient was extubated in the operating room and taken to recovery in stable and satisfactory condition having tolerated her operation well counts were correct at the end of the case.  PLAN OF CARE: Admit for overnight observation  PATIENT DISPOSITION:  PACU - hemodynamically stable.   Delay start of Pharmacological VTE agent (>24hrs) due to surgical blood loss or risk of bleeding: yes

## 2012-04-10 NOTE — Brief Op Note (Signed)
04/10/2012  1:28 PM  PATIENT:  [name]  78 y.o. female  PRE-OPERATIVE DIAGNOSIS:  Lumbar hnp without myelopathy, Lumbar spondylosis, Lumbar degenerative disc disease, Lumbar radiculopathy L 34 Right  POST-OPERATIVE DIAGNOSIS: Lumbar hnp without myelopathy, Lumbar spondylosis, Lumbar degenerative disc disease, Lumbar radiculopathy L 34 Right  PROCEDURE:  Procedure(s) with comments: LUMBAR LAMINECTOMY/DECOMPRESSION MICRODISCECTOMY 1 LEVEL (Right) - Right Lumbar three - four Microdiskectomy with microdissection  SURGEON:  Surgeon(s) and Role:    * Pansie Guggisberg, MD - Primary    * Henry Elsner, MD - Assisting  PHYSICIAN ASSISTANT:   ASSISTANTS: Poteat, RN   ANESTHESIA:   general  EBL:     BLOOD ADMINISTERED:none  DRAINS: none   LOCAL MEDICATIONS USED:  LIDOCAINE   SPECIMEN:  No Specimen  DISPOSITION OF SPECIMEN:  N/A  COUNTS:  YES  TOURNIQUET:  * No tourniquets in log *  DICTATION: DICTATION: Patient has a large L34 disc rupture on the right with significant right leg weakness. It was elected to take her to surgery for right L34 microdiscectomy.  Procedure: Patient was brought to the operating room and following the smooth and uncomplicated induction of general endotracheal anesthesia she was placed in a prone position on the Wilson frame. Low back was prepped and draped in the usual sterile fashion with betadine and DuraPrep. A pre-op localizing Xray was obtained.  Area of planned incision was infiltrated with local lidocaine. Incision was made in the midline and carried to the lumbodorsal fascia which was incised on the right side of midline. Subperiosteal dissection was performed exposing what was felt to be L34 level. Intraoperative x-ray demonstrated marker probes at L 34. A hemi-semi-laminectomy of L3 was performed a high-speed drill and completed with Kerrison rongeurs and a generous foraminotomy was performed overlying the superior aspect of the L4 lamina.  Ligamentum flavum was detached and removed in a piecemeal fashion and the L4 nerve root was decompressed laterally with removal of the superior aspect of the facet and ligamentum causing nerve root compression. The microscope was brought into the field and the L4 nerve root was mobilized medially. This exposed a large amount of soft disc material and a free fragment of herniated disc material. Multiple fragments were removed.  The disc was adherent to the underside of the L 3 nerve root and this herniated disc material was removed with persistent micro-dissection and with a variety of ball hooks. The disc herniation did not extend into the interspace and consequently this was not operated.  At this point it was felt that all neural elements were well decompressed and there was no evidence of residual loose disc material. Thewound was then irrigated with saline and no additional disc material was mobilized. Hemostasis was assured with bipolar electrocautery and the laminectomy defectwas irrigated with Depo-Medrol and fentanyl. The lumbodorsal fascia was closed with 0 Vicryl sutures the subcutaneous tissues reapproximated 2-0 Vicryl inverted sutures and the skin edges were reapproximated with 3-0 Vicryl subcuticular stitch. The wound is dressed with Dermabond. Patient was extubated in the operating room and taken to recovery in stable and satisfactory condition having tolerated her operation well counts were correct at the end of the case.  PLAN OF CARE: Admit for overnight observation  PATIENT DISPOSITION:  PACU - hemodynamically stable.   Delay start of Pharmacological VTE agent (>24hrs) due to surgical blood loss or risk of bleeding: yes  

## 2012-04-10 NOTE — Transfer of Care (Signed)
Immediate Anesthesia Transfer of Care Note  Patient: [name]  Procedure(s) Performed: Procedure(s) with comments: LUMBAR LAMINECTOMY/DECOMPRESSION MICRODISCECTOMY 1 LEVEL (Right) - Right Lumbar three - four Microdiskectomy  Patient Location: PACU  Anesthesia Type:General  Level of Consciousness: awake, alert  and oriented  Airway & Oxygen Therapy: Patient Spontanous Breathing and Patient connected to nasal cannula oxygen  Post-op Assessment: Report given to PACU RN and Post -op Vital signs reviewed and stable  Post vital signs: Reviewed and stable  Complications: No apparent anesthesia complications

## 2012-04-10 NOTE — H&P (Signed)
[name]  #[id number] DOB:  [birth year]   03/28/2012:     [name] returns today to review her MRI of the lumbar spine.  She was having left leg pain which has resolved with injection.  She now has a large disc herniation at L3-4 on the right with a large cephalad migrated free fragment causing significant right-sided nerve root compression.  She says she has miserable pain.   I recommended we go ahead with surgery on an expedited basis.  This will be a right L3-4 microdiskectomy.  She is weak in the right leg and quadriceps.  The plan is for surgery on 04/10/2012.  Risks and benefits were discussed with the patient who wishes to proceed.           Danae Orleans. Venetia Maxon, M.D./gde   [name] [phone] DOB:  77/20/1935   09/29/2010:  [name] returns today.  Her MRI shows left L4 nerve root compression within the neural foramen of mild to moderate degree, and spondylosis and degenerative changes L4-5 with left-sided foraminal stenosis.    She says she is better, but not completely better, but she still grades her pain as a 5/10 in severity.    Based on my review of her MRI and her persistent, but somewhat improved symptoms, I have recommended she pursue injection therapy. I have set her up with Dr. Ollen Bowl for a left L4 selective nerve root block.  We will see how she does with that and she will followup with me as needed.           Danae Orleans. 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  She is currently describing low back pain into her left high ranging up to 6 out of 10 in severity.  She says she has increased pain since June of 2012.  She says her low back is worse than her leg.  She says it worsened after she  harvested wheat.  She had x-rays of her lumbar spine and of her hip at [facility].  She has been taking Hydrocodone 5/500 up to 2 per day, Robaxin up to 2 per day of 500 mg.  She previously had left lower leg fracture in 2005 and a left total hip replacement by Dr. Eulah Pont in 2007.  She has a history of hypothyroidism, elevated cholesterol and hypertension.    [name] previously saw Dr. Lovell Sheehan for lumbar disc herniation and was found in 2002 to have a lumbar disc herniation at L4-5 to the left causing left L5 nerve root compression.  She was managed conservatively.  They did not, however, like their interaction with Dr. Lovell Sheehan and requested a change in doctors.    REVIEW OF SYSTEMS:   Review of Systems was reviewed with the patient.  Pertinent positives include wear glasses, high blood pressure, high cholesterol, back pain, arthritis, hypothyroidism.    PAST MEDICAL HISTORY:      Current Medical Conditions:  As previously described.      Prior Operations and Hospitalizations:  As previous described.      Medications and Allergies:  She currently take Hydrochlorothiazide 25 mg qd, Synthroid 125 mcg qd, Crestor 10 mg qd, Hydrocodone 5/500 prn, Methocarbamol 500 mg  tid, Fish Oil bid, Aspirin qd.  No known drug allergies.      Height and Weight:  Currently she is 5', 8" tall and 180 lbs.    FAMILY HISTORY:    Noncontributory.    SOCIAL HISTORY:    She denies tobacco, alcohol or drug use.    DIAGNOSTIC STUDIES:   Lumbar radiographs demonstrate multilevel spondylosis without significant malalignment.   PHYSICAL EXAMINATION:      General Appearance:  [name] is a pleasant, cooperative woman in no acute distress.      Blood Pressure, Pulse and Respiratory Rate:  Blood pressure is 144/82.  Heart rate is 74 and regular.  Respiratory rate is 18.        HEENT - normocephalic, atraumatic.  The pupils are equal, round and reactive to light.  The extraocular muscles are intact.   Sclerae - white.  Conjunctiva - pink.  Oropharynx benign.  Uvula midline.     Neck - there are no masses, meningismus, deformities, tracheal deviation, jugular vein distention or carotid bruits.  There is normal cervical range of motion.  Spurlings' test is negative without reproducible radicular pain turning the patient's head to either side.  Lhermitte's sign is not present with axial compression.      Respiratory - there is normal respiratory effort with good intercostal function.  Lungs are clear to auscultation.  There are no rales, rhonchi or wheezes.      Cardiovascular - the heart has regular rate and rhythm to auscultation.  No murmurs are appreciated.  There is no extremity edema, cyanosis or clubbing.  There are palpable pedal pulses.      Abdomen - soft, nontender, no hepatosplenomegaly appreciated or masses.  There are active bowel sounds.  No guarding or rebound.      Musculoskeletal Examination - currently she does have some pain in her left thigh and increased left leg pain when standing up.  She has tenderness over her left greater trochanter to palpation.  She has positive straight leg raise at 30 degrees on the left.  She is able to bend to touch her toes, able to stand on her heels and toes and has a normal casual gait.    NEUROLOGICAL EXAMINATION: The patient is oriented to time, person and place and has good recall of both recent and remote memory with normal attention span and concentration.  The patient speaks with clear and fluent speech and exhibits normal language function and appropriate fund of knowledge.      Cranial Nerve Examination - pupils are equal, round and reactive to light.  Extraocular movements are full.  Visual fields are full to confrontational testing.  Facial sensation and   facial movement are symmetric and intact.  Hearing is intact to finger rub.  Palate is upgoing.  Shoulder shrug is symmetric.  Tongue protrudes in the midline.      Motor Examination  - motor strength is 5/5 in the bilateral deltoids, biceps, triceps, handgrips, wrist extensors, interosseous.  In the lower extremities motor strength is 5/5 in hip flexion, extension, quadriceps, hamstrings, plantar flexion, dorsiflexion and extensor hallucis longus.      Sensory Examination - normal to light touch and pinprick sensation in the upper and lower extremities.     Deep Tendon Reflexes - 2 in the biceps, triceps and brachioradialis, 2 at the knees, 2 at the ankles and great toes are downgoing to plantar stimulation.      Cerebellar Examination - normal coordination in  upper and lower extremities and normal rapid alternating movements.  Romberg test is negative.    IMPRESSION AND RECOMMENDATIONS:   I have recommended to [name] that she undergo an MRI of her lumbar spine to see if there is a new disc herniation or worsening of the previous disc herniation as a potential explanation for her severe leg and back pain.  I will make further recommendations of treatment options after this has been performed.  I told her we may well proceed with injections or other nonsurgical treatment options.    [facility]    Danae Orleans. Venetia Maxon, M.D.

## 2012-04-11 NOTE — Progress Notes (Signed)
Pt doing well. Pt given D/C instructions with verbal understanding. Pt D/C'd home via wheelchair with daughter per MD order. Rema Fendt, RN

## 2012-04-11 NOTE — Progress Notes (Signed)
Subjective: Patient reports doing well  Objective: Vital signs in last 24 hours: Temp:  [97.2 F (36.2 C)-98.7 F (37.1 C)] 98.5 F (36.9 C) (03/26 0400) Pulse Rate:  [51-85] 79 (03/26 0400) Resp:  [15-25] 18 (03/26 0400) BP: (109-174)/(60-93) 163/66 mmHg (03/26 0400) SpO2:  [94 %-100 %] 97 % (03/26 0400)  Intake/Output from previous day: 03/25 0701 - 03/26 0700 In: 1380 [P.O.:480; I.V.:900] Out: 100 [Blood:100] Intake/Output this shift: Total I/O In: 240 [P.O.:240] Out: -   Physical Exam: Full strength, no pain or numbness.  Dressing CDI.  Lab Results: No results found for this basename: WBC, HGB, HCT, PLT,  in the last 72 hours BMET No results found for this basename: NA, K, CL, CO2, GLUCOSE, BUN, CREATININE, CALCIUM,  in the last 72 hours  Studies/Results: Dg Lumbar Spine 2-3 Views  04/10/2012  *RADIOLOGY REPORT*  Clinical Data: Lumbar surgery  LUMBAR SPINE - 2-3 VIEW  Comparison: MR 03/20/2012  Findings: The first lateral intraoperative lumbar radiograph shows a needle from posterior approach projecting behind the L3 spinous process.  The second film demonstrates surgical instruments and a probe projecting behind the L4 pedicle.  IMPRESSION:  Intraoperative localization   Original Report Authenticated By: D. Andria Rhein, MD     Assessment/Plan: D/C home.  F/U in 3 weeks in office.    LOS: 1 day    Dorian Heckle, MD 04/11/2012, 6:16 AM

## 2012-04-11 NOTE — Discharge Summary (Signed)
Physician Discharge Summary  Patient ID: [name] MRN: [id number] DOB/AGE: January 08, 1934 79 y.o.  Admit date: 04/10/2012 Discharge date: 04/11/2012  Admission Diagnoses: Herniated lumbar disc L 34 Right  Discharge Diagnoses: Same Active Problems:   * No active hospital problems. *   Discharged Condition: good  Hospital Course: Right L34 microdiscectomy  Consults: None  Significant Diagnostic Studies: None  Treatments: surgery: Right L34 microdiscectomy  Discharge Exam: Blood pressure 163/66, pulse 79, temperature 98.5 F (36.9 C), temperature source Oral, resp. rate 18, SpO2 97.00%. Neurologic: Alert and oriented X 3, normal strength and tone. Normal symmetric reflexes. Normal coordination and gait Wound:CDI  Disposition: Home     Medication List    TAKE these medications       celecoxib 200 MG capsule  Commonly known as:  CELEBREX  Take 200 mg by mouth daily.     hydrochlorothiazide 25 MG tablet  Commonly known as:  HYDRODIURIL  Take 25 mg by mouth daily.     HYDROcodone-acetaminophen 5-325 MG per tablet  Commonly known as:  NORCO/VICODIN  Take 1 tablet by mouth every 6 (six) hours as needed for pain.     levothyroxine 125 MCG tablet  Commonly known as:  SYNTHROID, LEVOTHROID  Take 125 mcg by mouth daily.     PHILLIPS STOOL SOFTENER 100 MG capsule  Generic drug:  docusate sodium  Take 100 mg by mouth 2 (two) times daily.     rosuvastatin 10 MG tablet  Commonly known as:  CRESTOR  Take 10 mg by mouth daily.     tiZANidine 2 MG tablet  Commonly known as:  ZANAFLEX  Take 2 mg by mouth every 8 (eight) hours as needed. Muscle spasm.         Signed: Dorian Heckle, MD 04/11/2012, 7:53 AM

## 2012-04-12 ENCOUNTER — Encounter (HOSPITAL_COMMUNITY): Payer: Self-pay | Admitting: Neurosurgery

## 2012-04-12 NOTE — Anesthesia Postprocedure Evaluation (Signed)
Anesthesia Post Note  Patient: [name]  Procedure(s) Performed: Procedure(s) (LRB): LUMBAR LAMINECTOMY/DECOMPRESSION MICRODISCECTOMY 1 LEVEL (Right)  Anesthesia type: General  Patient location: PACU  Post pain: Pain level controlled and Adequate analgesia  Post assessment: Post-op Vital signs reviewed, Patient's Cardiovascular Status Stable, Respiratory Function Stable, Patent Airway and Pain level controlled  Last Vitals:  Filed Vitals:   04/11/12 0825  BP: 146/52  Pulse: 94  Temp: 37.4 C  Resp: 18    Post vital signs: Reviewed and stable  Level of consciousness: awake, alert  and oriented  Complications: No apparent anesthesia complications

## 2013-09-12 IMAGING — CR DG LUMBAR SPINE 2-3V
1 series · 1 of 1 positions shown · non-contrast
Comparison: MR 03/20/2012

CLINICAL DATA: Lumbar surgery

LUMBAR SPINE - 2-3 VIEW

[view not recorded]
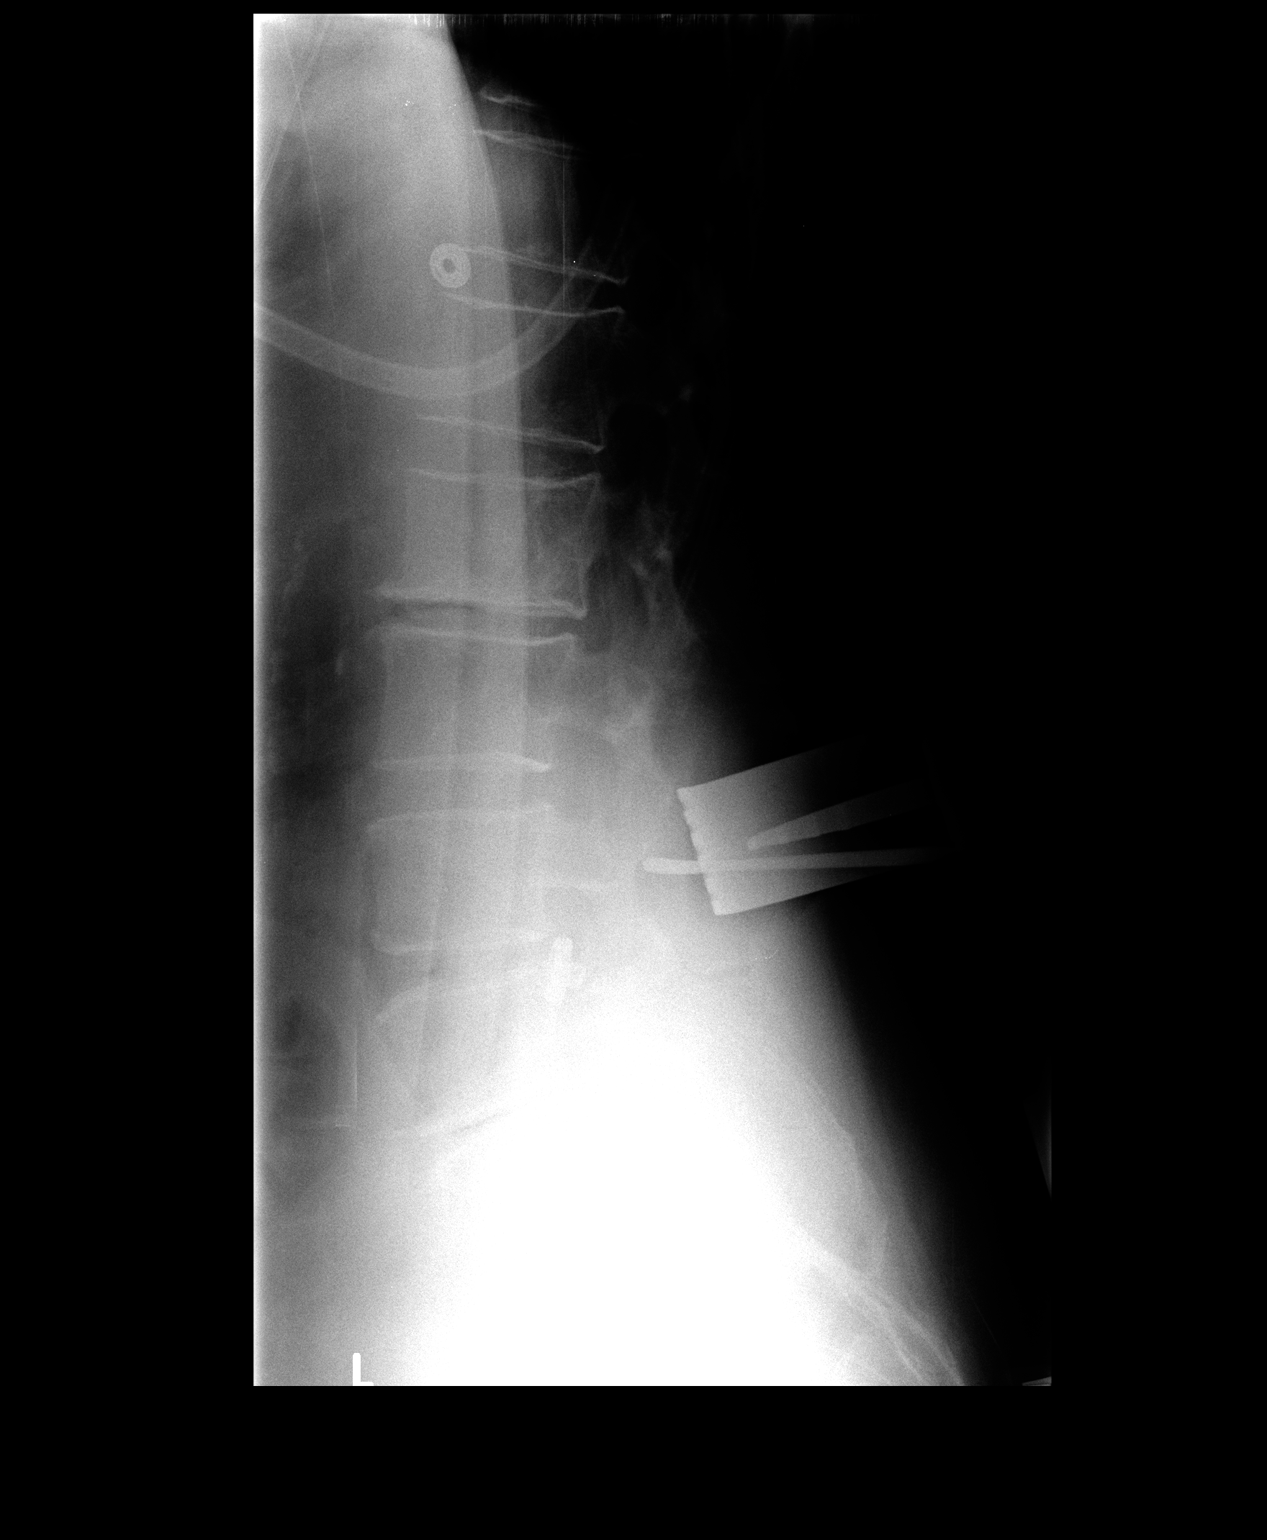

[1 of 1 positions shown; findings below may reference images not displayed]

FINDINGS: The first lateral intraoperative lumbar radiograph shows
a needle from posterior approach projecting behind the L3 spinous
process.  The second film demonstrates surgical instruments and a
probe projecting behind the L4 pedicle.
IMPRESSION: Intraoperative localization

## 2014-02-28 ENCOUNTER — Encounter (HOSPITAL_COMMUNITY): Payer: Self-pay | Admitting: Emergency Medicine

## 2014-02-28 ENCOUNTER — Emergency Department (HOSPITAL_COMMUNITY)
Admission: EM | Admit: 2014-02-28 | Discharge: 2014-02-28 | Disposition: A | Discharge status: Home / Self Care | Payer: Medicare Other | Attending: Emergency Medicine | Admitting: Emergency Medicine

## 2014-02-28 DIAGNOSIS — I10 Essential (primary) hypertension: Secondary | ICD-10-CM | POA: Insufficient documentation

## 2014-02-28 DIAGNOSIS — Y9389 Activity, other specified: Secondary | ICD-10-CM | POA: Insufficient documentation

## 2014-02-28 DIAGNOSIS — W01198A Fall on same level from slipping, tripping and stumbling with subsequent striking against other object, initial encounter: Secondary | ICD-10-CM | POA: Insufficient documentation

## 2014-02-28 DIAGNOSIS — E785 Hyperlipidemia, unspecified: Secondary | ICD-10-CM | POA: Diagnosis not present

## 2014-02-28 DIAGNOSIS — M1990 Unspecified osteoarthritis, unspecified site: Secondary | ICD-10-CM | POA: Diagnosis not present

## 2014-02-28 DIAGNOSIS — Z79899 Other long term (current) drug therapy: Secondary | ICD-10-CM | POA: Diagnosis not present

## 2014-02-28 DIAGNOSIS — Y998 Other external cause status: Secondary | ICD-10-CM | POA: Insufficient documentation

## 2014-02-28 DIAGNOSIS — Z791 Long term (current) use of non-steroidal anti-inflammatories (NSAID): Secondary | ICD-10-CM | POA: Diagnosis not present

## 2014-02-28 DIAGNOSIS — S01511A Laceration without foreign body of lip, initial encounter: Secondary | ICD-10-CM | POA: Diagnosis not present

## 2014-02-28 DIAGNOSIS — Y9289 Other specified places as the place of occurrence of the external cause: Secondary | ICD-10-CM | POA: Insufficient documentation

## 2014-02-28 MED ORDER — NAPROXEN 500 MG PO TABS: 500.0000 mg | ORAL_TABLET | Freq: Two times a day (BID) | ORAL | Status: DC

## 2014-02-28 MED ORDER — CYCLOBENZAPRINE HCL 10 MG PO TABS
10.0000 mg | ORAL_TABLET | Freq: Once | ORAL | Status: AC
  Administered 2014-02-28: 10 mg via ORAL
  Filled 2014-02-28: qty 1

## 2014-02-28 MED ORDER — IBUPROFEN 800 MG PO TABS
800.0000 mg | ORAL_TABLET | Freq: Once | ORAL | Status: AC
  Administered 2014-02-28: 800 mg via ORAL
  Filled 2014-02-28: qty 1

## 2014-02-28 MED ORDER — CYCLOBENZAPRINE HCL 10 MG PO TABS: 10.0000 mg | ORAL_TABLET | Freq: Two times a day (BID) | ORAL | Status: DC | PRN

## 2014-02-28 MED ORDER — LIDOCAINE HCL (PF) 1 % IJ SOLN
5.0000 mL | Freq: Once | INTRAMUSCULAR | Status: AC
  Administered 2014-02-28: 5 mL via INTRADERMAL

## 2014-02-28 MED ORDER — POVIDONE-IODINE 10 % EX SOLN
CUTANEOUS | Status: AC
  Filled 2014-02-28: qty 118

## 2014-02-28 MED ORDER — PENICILLIN V POTASSIUM 500 MG PO TABS: 500.0000 mg | ORAL_TABLET | Freq: Three times a day (TID) | ORAL | Status: DC

## 2014-02-28 MED ORDER — LIDOCAINE HCL (PF) 1 % IJ SOLN
INTRAMUSCULAR | Status: AC
  Filled 2014-02-28: qty 5

## 2014-02-28 NOTE — Discharge Instructions (Signed)
Laceration:  The laceration is a cut or lesion that goes through all layers of the skin and into the tissue just beneath the skin. This may have been repaired by your caregiver with either stitches or a tissue adhesive similar to a super glue.  Please keep your wound clean and dry with a topical antibiotic and a sterile dressing for the next 48 hours. Your wound should be reevaluated by your family doctor within the next 2 days for a recheck. If you do not have a family doctor you may return to the emergency department for a recheck or see the list of followup doctors below.  Seek medical attention if:   There is redness, swelling, increasing pain in the wound  There is a red line that goes up your arm or leg  Pus is coming from the wound  He developed an unexplained temperature above 100.36F  He noticed a foul-smelling coming from the wound or dressing  There is a breaking open of the wound after the sutures have been removed  If you did not receive a tetanus shot today because she thought she were up to date but did not recall when her last one was given, nature to check with her primary caregiver to determine if she needs one.   RESOURCE GUIDE  Dental Problems  Patients with Medicaid: [place] Phone:  [phone]                                                  Phone:  [phone]  If unable to pay or uninsured, contact:  [facility] or [facility]. to become qualified for the adult dental clinic.  Chronic Pain Problems Contact [facility]  [phone] Patients need to be referred by their primary care doctor.  Insufficient Money for Medicine Contact [employer]:  call "211" or [place] [phone].  No Primary Care Doctor Call [facility]  [phone] Other agencies that provide inexpensive medical  care    [place]  [phone]    [facility]  [place]  [phone]    [facility]  [phone]    [facility]  [place]  [place]  [phone] [place]   [phone] (emergency services [phone])  Substance Abuse Resources [facility] Drug Services  [phone] [facility] [phone] [place] [phone] [place] [phone] Residential & Outpatient Substance Abuse Program  [phone]  Abuse/Neglect [place] [phone] [place] [phone] (After Hours)  Emergency [facility] [phone]  [place] at the [place] [phone] [place] [phone]  MRSA Hotline #:   548-131-6521    [facility] Dept. [address]      [place] Phone:  [phone]                                   Phone:  [phone]                 Phone:  [place] Phone:  [place] [phone] (After Hours)

## 2014-02-28 NOTE — ED Notes (Addendum)
Pt states she tripped and fell onto face today. Denies loc. Pt has laceration to upper lip and broken right front tooth (bridge). Bleeding controlled.

## 2014-02-28 NOTE — ED Provider Notes (Signed)
CSN: [id number]     Arrival date & time 02/28/14  1423 History   First MD Initiated Contact with Patient 02/28/14 1533     Chief Complaint  Patient presents with  . Fall     (Consider location/radiation/quality/duration/timing/severity/associated sxs/prior Treatment) HPI Comments: [place] just pta outside when tripped on flower bed-  Struck face on ground, no LOC , no other pain other than upper lip and broken tooth (implant).  She is ambulatory without difficulty.  Sx are mild, persistent, associated with bleeding which has stopped with pressure on the wound.  Patient is a 81 y.o. female presenting with fall. The history is provided by the patient and the spouse.  Fall Pertinent negatives include no headaches.    Past Medical History  Diagnosis Date  . Hypertension   . Arthritis   . Nocturia   . Hyperlipemia    Past Surgical History  Procedure Laterality Date  . Fracture surgery      left leg  rod  . Joint replacement      hip  . Knee arthroscopy    . Lumbar laminectomy/decompression microdiscectomy Right 04/10/2012    Procedure: LUMBAR LAMINECTOMY/DECOMPRESSION MICRODISCECTOMY 1 LEVEL;  Surgeon: Erline Levine, MD;  Location: [facility];  Service: Neurosurgery;  Laterality: Right;  Right Lumbar three - four Microdiskectomy   No family history on file. History  Substance Use Topics  . Smoking status: Never Smoker   . Smokeless tobacco: Not on file  . Alcohol Use: No   OB History    No data available     Review of Systems  Eyes: Negative for visual disturbance.  Skin: Positive for wound.  Neurological: Negative for headaches.      Allergies  Review of patient's allergies indicates no known allergies.  Home Medications   Prior to Admission medications   Medication Sig Start Date End Date Taking? Authorizing Provider  celecoxib (CELEBREX) 200 MG capsule Take 200 mg by mouth daily.    Historical Provider, MD  cyclobenzaprine (FLEXERIL) 10 MG tablet Take 1 tablet  (10 mg total) by mouth 2 (two) times daily as needed for muscle spasms. 02/28/14   Johnna Acosta, MD  docusate sodium (PHILLIPS STOOL SOFTENER) 100 MG capsule Take 100 mg by mouth 2 (two) times daily.    Historical Provider, MD  hydrochlorothiazide (HYDRODIURIL) 25 MG tablet Take 25 mg by mouth daily.    Historical Provider, MD  HYDROcodone-acetaminophen (NORCO/VICODIN) 5-325 MG per tablet Take 1 tablet by mouth every 6 (six) hours as needed for pain.    Historical Provider, MD  levothyroxine (SYNTHROID, LEVOTHROID) 125 MCG tablet Take 125 mcg by mouth daily.    Historical Provider, MD  naproxen (NAPROSYN) 500 MG tablet Take 1 tablet (500 mg total) by mouth 2 (two) times daily with a meal. 02/28/14   Johnna Acosta, MD  penicillin v potassium (VEETID) 500 MG tablet Take 1 tablet (500 mg total) by mouth 3 (three) times daily. 02/28/14   Johnna Acosta, MD  rosuvastatin (CRESTOR) 10 MG tablet Take 10 mg by mouth daily.    Historical Provider, MD  tiZANidine (ZANAFLEX) 2 MG tablet Take 2 mg by mouth every 8 (eight) hours as needed. Muscle spasm.    Historical Provider, MD   BP 189/89 mmHg  Pulse 82  Temp(Src) 98.2 F (36.8 C) (Oral)  Resp 18  Ht 5\' 8"  (1.727 m)  Wt 178 lb (80.74 kg)  BMI 27.07 kg/m2  SpO2 97% Physical Exam  Constitutional:  She appears well-developed and well-nourished. No distress.  HENT:  Head: Normocephalic.  Mouth/Throat: Oropharynx is clear and moist. No oropharyngeal exudate.   facial tenderness over upper lia with laceration.  This is 2cm in length midline and does not cross V border.  inner upper lip with small lac < 72mm, no deformity, malocclusion or hemotympanum.  no battle's sign or racoon eyes.   Eyes: Conjunctivae and EOM are normal. Pupils are equal, round, and reactive to light. Right eye exhibits no discharge. Left eye exhibits no discharge. No scleral icterus.  Neck: Normal range of motion. Neck supple. No JVD present. No thyromegaly present.  Cardiovascular:  Normal rate, regular rhythm, normal heart sounds and intact distal pulses.  Exam reveals no gallop and no friction rub.   No murmur heard. Pulmonary/Chest: Effort normal and breath sounds normal. No respiratory distress. She has no wheezes. She has no rales.  Abdominal: Soft. Bowel sounds are normal. She exhibits no distension and no mass. There is no tenderness.  Musculoskeletal: Normal range of motion. She exhibits no edema or tenderness.  Lymphadenopathy:    She has no cervical adenopathy.  Neurological: She is alert. Coordination normal.  Skin: Skin is warm and dry. No rash noted. No erythema.  Psychiatric: She has a normal mood and affect. Her behavior is normal.  Nursing note and vitals reviewed.   ED Course  Procedures (including critical care time) Labs Review Labs Reviewed - No data to display  Imaging Review No results found.   EKG Interpretation None      MDM   Final diagnoses:  Laceration of upper lip with complication, initial encounter    No injuries other than upper lip - dentition intact other than some porcelain from implant - explore wound under local, VS with mild htn - states is taking meds as Rx, likely elevated 2/2 incident and anxiety.  LACERATION REPAIR Performed by: Johnna Acosta Authorized by: Johnna Acosta Consent: Verbal consent obtained. Risks and benefits: risks, benefits and alternatives were discussed Consent given by: patient Patient identity confirmed: provided demographic data Prepped and Draped in normal sterile fashion Wound explored  Laceration Location: upper lip  Laceration Length: 2cm  Foreign Bodies seen and removed under direct visualization with forceps and irrigation.  Anesthesia: local infiltration  Local anesthetic: lidocaine 1% without epinephrine  Anesthetic total: 2 ml  Irrigation method: syringe Amount of cleaning: extensive  Skin closure: 6-0 prolene  Number of sutures: 5 (4 external 6-0 sutures  prolene, 1 internal suture chromic 5-0)  Technique: Simple Interrupted.  Patient tolerance: Patient tolerated the procedure well with no immediate complications.   Meds given in ED:  Medications  lidocaine (PF) (XYLOCAINE) 1 % injection 5 mL (not administered)  lidocaine (PF) (XYLOCAINE) 1 % injection (not administered)  povidone-iodine (BETADINE) 10 % external solution (not administered)  cyclobenzaprine (FLEXERIL) tablet 10 mg (10 mg Oral Given 02/28/14 1609)  ibuprofen (ADVIL,MOTRIN) tablet 800 mg (800 mg Oral Given 02/28/14 1609)    New Prescriptions   CYCLOBENZAPRINE (FLEXERIL) 10 MG TABLET    Take 1 tablet (10 mg total) by mouth 2 (two) times daily as needed for muscle spasms.   NAPROXEN (NAPROSYN) 500 MG TABLET    Take 1 tablet (500 mg total) by mouth 2 (two) times daily with a meal.   PENICILLIN V POTASSIUM (VEETID) 500 MG TABLET    Take 1 tablet (500 mg total) by mouth 3 (three) times daily.       Johnna Acosta, MD 02/28/14  1611 

## 2014-02-28 NOTE — ED Notes (Signed)
Upper lip area cleansed sutures intact denies pain lip remain numb at this time. Husband at bedside

## 2014-09-02 ENCOUNTER — Other Ambulatory Visit: Payer: Self-pay | Admitting: Physician Assistant

## 2014-09-02 NOTE — H&P (Signed)
TOTAL KNEE ADMISSION H&P  Patient is being admitted for right total knee arthroplasty.  Subjective:  Chief Complaint:right knee pain.  HPI: [name], 81 y.o. female, has a history of pain and functional disability in the right knee due to arthritis and has failed non-surgical conservative treatments for greater than 12 weeks to includeNSAID's and/or analgesics and corticosteriod injections.  Onset of symptoms was gradual, starting 2 years ago with gradually worsening course since that time. The patient noted no past surgery on the right knee(s).  Patient currently rates pain in the right knee(s) at 6 out of 10 with activity. Patient has night pain and worsening of pain with activity and weight bearing.  Patient has evidence of subchondral sclerosis and joint space narrowing by imaging. There is no active infection.  Patient Active Problem List   Diagnosis Date Noted  . COLONIC POLYPS, ADENOMATOUS, HX OF 10/07/2008  . HYPOTHYROIDISM 10/06/2008  . HYPERCHOLESTEROLEMIA 10/06/2008  . DEPRESSION 10/06/2008  . HYPERTENSION 10/06/2008  . HEMORRHOIDS 10/06/2008  . DIVERTICULAR DISEASE 10/06/2008  . OSTEOPOROSIS 10/06/2008  . DYSPNEA 10/06/2008  . CHEST DISCOMFORT 10/06/2008  . COLONOSCOPY, HX OF 10/06/2008  . ARTHROSCOPY, RIGHT KNEE, HX OF 10/06/2008   Past Medical History  Diagnosis Date  . Hypertension   . Arthritis   . Nocturia   . Hyperlipemia     Past Surgical History  Procedure Laterality Date  . Fracture surgery      left leg  rod  . Joint replacement      hip  . Knee arthroscopy    . Lumbar laminectomy/decompression microdiscectomy Right 04/10/2012    Procedure: LUMBAR LAMINECTOMY/DECOMPRESSION MICRODISCECTOMY 1 LEVEL;  Surgeon: Erline Levine, MD;  Location: [facility];  Service: Neurosurgery;  Laterality: Right;  Right Lumbar three - four Microdiskectomy     (Not in a hospital admission) No Known Allergies  Social History  Substance Use Topics  . Smoking  status: Never Smoker   . Smokeless tobacco: Not on file  . Alcohol Use: No    No family history on file.   Review of Systems  Constitutional: Negative.   HENT: Negative.   Eyes: Negative.   Respiratory: Negative.   Cardiovascular: Negative.   Gastrointestinal: Negative.   Genitourinary: Negative.   Musculoskeletal: Positive for joint pain.  Skin: Negative.   Neurological: Negative.   Endo/Heme/Allergies: Negative.   Psychiatric/Behavioral: Negative.     Objective:  Physical Exam  Constitutional: She is oriented to person, place, and time. She appears well-developed and well-nourished.  HENT:  Head: Normocephalic and atraumatic.  Eyes: EOM are normal. Pupils are equal, round, and reactive to light.  Neck: Normal range of motion. Neck supple.  Cardiovascular: Normal rate and regular rhythm.   Respiratory: Effort normal and breath sounds normal. No respiratory distress. She has no wheezes.  GI: Soft. Bowel sounds are normal.  Musculoskeletal:  Examination of her right knee reveals a 1+ effusion.  Range of motion 0-110 degrees.  Marked patellofemoral crepitus.  Minimal joint line tenderness.  Positive straight leg raise.  Negative log roll.    Neurological: She is alert and oriented to person, place, and time.  Skin: Skin is warm and dry.  Psychiatric: She has a normal mood and affect. Her behavior is normal. Judgment and thought content normal.    Vital signs in last 24 hours: @VSRANGES @  Labs:   Estimated body mass index is 27.07 kg/(m^2) as calculated from the following:   Height as of 02/28/14: 5\' 8"  (1.727  m).   Weight as of 02/28/14: 80.74 kg (178 lb).   Imaging Review Plain radiographs demonstrate severe degenerative joint disease of the right knee(s). The overall alignment ismild varus. The bone quality appears to be fair for age and reported activity level.  Assessment/Plan:  End stage arthritis, right knee   The patient history, physical examination,  clinical judgment of the provider and imaging studies are consistent with end stage degenerative joint disease of the right knee(s) and total knee arthroplasty is deemed medically necessary. The treatment options including medical management, injection therapy arthroscopy and arthroplasty were discussed at length. The risks and benefits of total knee arthroplasty were presented and reviewed. The risks due to aseptic loosening, infection, stiffness, patella tracking problems, thromboembolic complications and other imponderables were discussed. The patient acknowledged the explanation, agreed to proceed with the plan and consent was signed. Patient is being admitted for inpatient treatment for surgery, pain control, PT, OT, prophylactic antibiotics, VTE prophylaxis, progressive ambulation and ADL's and discharge planning. The patient is planning to be discharged home with home health services

## 2014-09-12 ENCOUNTER — Encounter (HOSPITAL_COMMUNITY): Payer: Self-pay

## 2014-09-12 ENCOUNTER — Encounter (HOSPITAL_COMMUNITY)
Admission: RE | Admit: 2014-09-12 | Discharge: 2014-09-12 | Disposition: A | Discharge status: Home / Self Care | Payer: Medicare Other | Source: Ambulatory Visit | Attending: Orthopedic Surgery | Admitting: Orthopedic Surgery

## 2014-09-12 DIAGNOSIS — Z01812 Encounter for preprocedural laboratory examination: Secondary | ICD-10-CM | POA: Insufficient documentation

## 2014-09-12 DIAGNOSIS — Z0183 Encounter for blood typing: Secondary | ICD-10-CM | POA: Diagnosis not present

## 2014-09-12 DIAGNOSIS — M1711 Unilateral primary osteoarthritis, right knee: Secondary | ICD-10-CM | POA: Insufficient documentation

## 2014-09-12 DIAGNOSIS — R9431 Abnormal electrocardiogram [ECG] [EKG]: Secondary | ICD-10-CM | POA: Diagnosis not present

## 2014-09-12 HISTORY — DX: Hypothyroidism, unspecified: E03.9

## 2014-09-12 HISTORY — DX: Anemia, unspecified: D64.9

## 2014-09-12 LAB — COMPREHENSIVE METABOLIC PANEL
ALBUMIN: 4.1 g/dL (ref 3.5–5.0)
ALT: 39 U/L (ref 14–54)
AST: 44 U/L — AB (ref 15–41)
Alkaline Phosphatase: 44 U/L (ref 38–126)
Anion gap: 8 (ref 5–15)
BUN: 11 mg/dL (ref 6–20)
CHLORIDE: 105 mmol/L (ref 101–111)
CO2: 27 mmol/L (ref 22–32)
CREATININE: 1.11 mg/dL — AB (ref 0.44–1.00)
Calcium: 9.9 mg/dL (ref 8.9–10.3)
GFR calc Af Amer: 53 mL/min — ABNORMAL LOW (ref >60)
GFR calc non Af Amer: 46 mL/min — ABNORMAL LOW (ref >60)
GLUCOSE: 105 mg/dL — AB (ref 65–99)
POTASSIUM: 3.9 mmol/L (ref 3.5–5.1)
SODIUM: 140 mmol/L (ref 135–145)
Total Bilirubin: 0.7 mg/dL (ref 0.3–1.2)
Total Protein: 7.2 g/dL (ref 6.5–8.1)

## 2014-09-12 LAB — TYPE AND SCREEN
ABO/RH(D): O POS
ANTIBODY SCREEN: NEGATIVE

## 2014-09-12 LAB — CBC WITH DIFFERENTIAL/PLATELET
BASOS ABS: 0.1 10*3/uL (ref 0.0–0.1)
BASOS PCT: 1 % (ref 0–1)
EOS ABS: 0.1 10*3/uL (ref 0.0–0.7)
EOS PCT: 2 % (ref 0–5)
HCT: 41.5 % (ref 36.0–46.0)
Hemoglobin: 14.1 g/dL (ref 12.0–15.0)
LYMPHS PCT: 40 % (ref 12–46)
Lymphs Abs: 2.6 10*3/uL (ref 0.7–4.0)
MCH: 32.8 pg (ref 26.0–34.0)
MCHC: 34 g/dL (ref 30.0–36.0)
MCV: 96.5 fL (ref 78.0–100.0)
MONO ABS: 0.5 10*3/uL (ref 0.1–1.0)
Monocytes Relative: 7 % (ref 3–12)
Neutro Abs: 3.3 10*3/uL (ref 1.7–7.7)
Neutrophils Relative %: 50 % (ref 43–77)
PLATELETS: 182 10*3/uL (ref 150–400)
RBC: 4.3 MIL/uL (ref 3.87–5.11)
RDW: 13.2 % (ref 11.5–15.5)
WBC: 6.6 10*3/uL (ref 4.0–10.5)

## 2014-09-12 LAB — APTT: APTT: 27 s (ref 24–37)

## 2014-09-12 LAB — SURGICAL PCR SCREEN
MRSA, PCR: NEGATIVE
Staphylococcus aureus: NEGATIVE

## 2014-09-12 LAB — PROTIME-INR
INR: 1.06 (ref 0.00–1.49)
PROTHROMBIN TIME: 14 s (ref 11.6–15.2)

## 2014-09-12 NOTE — Pre-Procedure Instructions (Addendum)
CLORENE NERIO  09/12/2014      [facility] [place] [address] Phone: [phone] Fax: [phone]    Your procedure is scheduled on 09/24/14  Report to [facility] short stay admitting at 930 A.M.  Call this number if you have problems the morning of surgery:  [phone]   Remember:  Do not eat food or drink liquids after midnight.  Take these medicines the morning of surgery with A SIP OF WATER levothyroxine,flexeril if needed,tizanidine if needed, hydrocodone if needed    STOP all herbel meds, nsaids (aleve,naproxen,advil,ibuprofen) 5 days prior to surgery starting 09/19/14 including vitamins ,celebrex   Do not wear jewelry, make-up or nail polish.  Do not wear lotions, powders, or perfumes.  You may wear deodorant.  Do not shave 48 hours prior to surgery.  Men may shave face and neck.  Do not bring valuables to the hospital.  [facility] is not responsible for any belongings or valuables.  Contacts, dentures or bridgework may not be worn into surgery.  Leave your suitcase in the car.  After surgery it may be brought to your room.  For patients admitted to the hospital, discharge time will be determined by your treatment team.  Patients discharged the day of surgery will not be allowed to drive home.   Name and phone number of your driver:    Special instructions:   Special Instructions: [facility] - Preparing for Surgery  Before surgery, you can play an important role.  Because skin is not sterile, your skin needs to be as free of germs as possible.  You can reduce the number of germs on you skin by washing with CHG (chlorahexidine gluconate) soap before surgery.  CHG is an antiseptic cleaner which kills germs and bonds with the skin to continue killing germs even after washing.  Please DO NOT use if you have an allergy to CHG or antibacterial soaps.  If your skin becomes reddened/irritated stop  using the CHG and inform your nurse when you arrive at Short Stay.  Do not shave (including legs and underarms) for at least 48 hours prior to the first CHG shower.  You may shave your face.  Please follow these instructions carefully:   1.  Shower with CHG Soap the night before surgery and the morning of Surgery.  2.  If you choose to wash your hair, wash your hair first as usual with your normal shampoo.  3.  After you shampoo, rinse your hair and body thoroughly to remove the Shampoo.  4.  Use CHG as you would any other liquid soap.  You can apply chg directly  to the skin and wash gently with scrungie or a clean washcloth.  5.  Apply the CHG Soap to your body ONLY FROM THE NECK DOWN.  Do not use on open wounds or open sores.  Avoid contact with your eyes ears, mouth and genitals (private parts).  Wash genitals (private parts)       with your normal soap.  6.  Wash thoroughly, paying special attention to the area where your surgery will be performed.  7.  Thoroughly rinse your body with warm water from the neck down.  8.  DO NOT shower/wash with your normal soap after using and rinsing off the CHG Soap.  9.  Pat yourself dry with a clean towel.  10.  Wear clean pajamas.            11.  Place clean sheets on your bed the night of your first shower and do not sleep with pets.  Day of Surgery  Do not apply any lotions/deodorants the morning of surgery.  Please wear clean clothes to the hospital/surgery center.  Please read over the following fact sheets that you were given. Pain Booklet, Coughing and Deep Breathing, Blood Transfusion Information, Total Joint Packet, MRSA Information and Surgical Site Infection Prevention

## 2014-09-13 LAB — URINE CULTURE

## 2014-09-24 ENCOUNTER — Encounter (HOSPITAL_COMMUNITY): Payer: Self-pay | Admitting: Surgery

## 2014-09-24 ENCOUNTER — Inpatient Hospital Stay (HOSPITAL_COMMUNITY): Payer: Medicare Other

## 2014-09-24 ENCOUNTER — Inpatient Hospital Stay (HOSPITAL_COMMUNITY): Payer: Medicare Other | Admitting: Anesthesiology

## 2014-09-24 ENCOUNTER — Encounter (HOSPITAL_COMMUNITY)
Admission: RE | Disposition: A | Discharge status: Home Health | Payer: Self-pay | Source: Ambulatory Visit | Attending: Orthopedic Surgery

## 2014-09-24 ENCOUNTER — Inpatient Hospital Stay (HOSPITAL_COMMUNITY)
Admission: RE | Admit: 2014-09-24 | Discharge: 2014-09-26 | DRG: 470 | Disposition: A | Discharge status: Home Health | Payer: Medicare Other | Source: Ambulatory Visit | Attending: Orthopedic Surgery | Admitting: Orthopedic Surgery

## 2014-09-24 DIAGNOSIS — E785 Hyperlipidemia, unspecified: Secondary | ICD-10-CM | POA: Diagnosis present

## 2014-09-24 DIAGNOSIS — M1711 Unilateral primary osteoarthritis, right knee: Secondary | ICD-10-CM | POA: Diagnosis not present

## 2014-09-24 DIAGNOSIS — M549 Dorsalgia, unspecified: Secondary | ICD-10-CM | POA: Diagnosis present

## 2014-09-24 DIAGNOSIS — J449 Chronic obstructive pulmonary disease, unspecified: Secondary | ICD-10-CM | POA: Diagnosis not present

## 2014-09-24 DIAGNOSIS — D62 Acute posthemorrhagic anemia: Secondary | ICD-10-CM | POA: Diagnosis not present

## 2014-09-24 DIAGNOSIS — M8580 Other specified disorders of bone density and structure, unspecified site: Secondary | ICD-10-CM | POA: Diagnosis present

## 2014-09-24 DIAGNOSIS — G8929 Other chronic pain: Secondary | ICD-10-CM | POA: Diagnosis present

## 2014-09-24 DIAGNOSIS — E039 Hypothyroidism, unspecified: Secondary | ICD-10-CM | POA: Diagnosis present

## 2014-09-24 DIAGNOSIS — M1710 Unilateral primary osteoarthritis, unspecified knee: Secondary | ICD-10-CM | POA: Diagnosis present

## 2014-09-24 DIAGNOSIS — M179 Osteoarthritis of knee, unspecified: Secondary | ICD-10-CM | POA: Diagnosis present

## 2014-09-24 DIAGNOSIS — I10 Essential (primary) hypertension: Secondary | ICD-10-CM | POA: Diagnosis not present

## 2014-09-24 DIAGNOSIS — Z96659 Presence of unspecified artificial knee joint: Secondary | ICD-10-CM

## 2014-09-24 HISTORY — PX: TOTAL KNEE ARTHROPLASTY: SHX125

## 2014-09-24 SURGERY — ARTHROPLASTY, KNEE, TOTAL
Anesthesia: General | Site: Knee | Laterality: Right

## 2014-09-24 MED ORDER — POTASSIUM CHLORIDE IN NACL 20-0.9 MEQ/L-% IV SOLN
INTRAVENOUS | Status: DC
  Administered 2014-09-25: 100 mL/h via INTRAVENOUS
  Filled 2014-09-24: qty 1000

## 2014-09-24 MED ORDER — BUPIVACAINE LIPOSOME 1.3 % IJ SUSP
INTRAMUSCULAR | Status: DC | PRN
  Administered 2014-09-24: 20 mL

## 2014-09-24 MED ORDER — 0.9 % SODIUM CHLORIDE (POUR BTL) OPTIME
TOPICAL | Status: DC | PRN
  Administered 2014-09-24: 1000 mL

## 2014-09-24 MED ORDER — LIDOCAINE HCL (CARDIAC) 20 MG/ML IV SOLN
INTRAVENOUS | Status: DC | PRN
  Administered 2014-09-24: 60 mg via INTRAVENOUS

## 2014-09-24 MED ORDER — HYDROMORPHONE HCL 1 MG/ML IJ SOLN
0.2500 mg | INTRAMUSCULAR | Status: DC | PRN
  Administered 2014-09-24 (×2): 0.5 mg via INTRAVENOUS

## 2014-09-24 MED ORDER — APIXABAN 2.5 MG PO TABS
2.5000 mg | ORAL_TABLET | Freq: Two times a day (BID) | ORAL | Status: DC
  Administered 2014-09-25 – 2014-09-26 (×2): 2.5 mg via ORAL
  Filled 2014-09-24 (×3): qty 1

## 2014-09-24 MED ORDER — ONDANSETRON HCL 4 MG PO TABS: 4.0000 mg | ORAL_TABLET | Freq: Four times a day (QID) | ORAL | Status: DC | PRN

## 2014-09-24 MED ORDER — APIXABAN 2.5 MG PO TABS: ORAL_TABLET | ORAL | Status: DC

## 2014-09-24 MED ORDER — FENTANYL CITRATE (PF) 250 MCG/5ML IJ SOLN
INTRAMUSCULAR | Status: DC | PRN
  Administered 2014-09-24 (×3): 50 ug via INTRAVENOUS
  Administered 2014-09-24: 100 ug via INTRAVENOUS

## 2014-09-24 MED ORDER — MIDAZOLAM HCL 2 MG/2ML IJ SOLN: 0.5000 mg | Freq: Once | INTRAMUSCULAR | Status: DC | PRN

## 2014-09-24 MED ORDER — METOCLOPRAMIDE HCL 5 MG/ML IJ SOLN: 5.0000 mg | Freq: Three times a day (TID) | INTRAMUSCULAR | Status: DC | PRN

## 2014-09-24 MED ORDER — ALUM & MAG HYDROXIDE-SIMETH 200-200-20 MG/5ML PO SUSP: 30.0000 mL | ORAL | Status: DC | PRN

## 2014-09-24 MED ORDER — MENTHOL 3 MG MT LOZG: 1.0000 | LOZENGE | OROMUCOSAL | Status: DC | PRN

## 2014-09-24 MED ORDER — PROPOFOL 10 MG/ML IV BOLUS
INTRAVENOUS | Status: DC | PRN
  Administered 2014-09-24: 200 mg via INTRAVENOUS

## 2014-09-24 MED ORDER — BUPIVACAINE HCL 0.5 % IJ SOLN
INTRAMUSCULAR | Status: DC | PRN
  Administered 2014-09-24: 10 mL

## 2014-09-24 MED ORDER — MIDAZOLAM HCL 2 MG/2ML IJ SOLN
INTRAMUSCULAR | Status: AC
  Filled 2014-09-24: qty 2

## 2014-09-24 MED ORDER — DEXAMETHASONE SODIUM PHOSPHATE 4 MG/ML IJ SOLN
INTRAMUSCULAR | Status: DC | PRN
  Administered 2014-09-24: 4 mg via INTRAVENOUS

## 2014-09-24 MED ORDER — BUPIVACAINE HCL (PF) 0.25 % IJ SOLN
INTRAMUSCULAR | Status: AC
  Filled 2014-09-24: qty 30

## 2014-09-24 MED ORDER — LACTATED RINGERS IV SOLN
INTRAVENOUS | Status: DC
  Administered 2014-09-24 (×2): via INTRAVENOUS

## 2014-09-24 MED ORDER — CHLORHEXIDINE GLUCONATE 4 % EX LIQD: 60.0000 mL | Freq: Once | CUTANEOUS | Status: DC

## 2014-09-24 MED ORDER — ONDANSETRON HCL 4 MG PO TABS: 4.0000 mg | ORAL_TABLET | Freq: Three times a day (TID) | ORAL | Status: DC | PRN

## 2014-09-24 MED ORDER — BISACODYL 5 MG PO TBEC: 5.0000 mg | DELAYED_RELEASE_TABLET | Freq: Every day | ORAL | Status: DC | PRN

## 2014-09-24 MED ORDER — HYDROMORPHONE HCL 1 MG/ML IJ SOLN: 0.5000 mg | INTRAMUSCULAR | Status: DC | PRN

## 2014-09-24 MED ORDER — DOCUSATE SODIUM 100 MG PO CAPS
100.0000 mg | ORAL_CAPSULE | Freq: Two times a day (BID) | ORAL | Status: DC
  Administered 2014-09-24 – 2014-09-26 (×4): 100 mg via ORAL
  Filled 2014-09-24 (×4): qty 1

## 2014-09-24 MED ORDER — MAGNESIUM CITRATE PO SOLN: 1.0000 | Freq: Once | ORAL | Status: DC | PRN

## 2014-09-24 MED ORDER — CELECOXIB 200 MG PO CAPS
200.0000 mg | ORAL_CAPSULE | Freq: Two times a day (BID) | ORAL | Status: DC
  Administered 2014-09-24 – 2014-09-26 (×4): 200 mg via ORAL
  Filled 2014-09-24 (×4): qty 1

## 2014-09-24 MED ORDER — SODIUM CHLORIDE 0.9 % IJ SOLN
INTRAMUSCULAR | Status: DC | PRN
  Administered 2014-09-24: 40 mL

## 2014-09-24 MED ORDER — BUPIVACAINE LIPOSOME 1.3 % IJ SUSP
20.0000 mL | INTRAMUSCULAR | Status: DC
  Filled 2014-09-24: qty 20

## 2014-09-24 MED ORDER — CEFAZOLIN SODIUM 1-5 GM-% IV SOLN
1.0000 g | Freq: Four times a day (QID) | INTRAVENOUS | Status: AC
  Administered 2014-09-25: 1 g via INTRAVENOUS
  Filled 2014-09-24 (×2): qty 50

## 2014-09-24 MED ORDER — MEPERIDINE HCL 25 MG/ML IJ SOLN
6.2500 mg | INTRAMUSCULAR | Status: DC | PRN
Start: 2014-09-24 — End: 2014-09-24

## 2014-09-24 MED ORDER — OXYCODONE HCL 5 MG PO TABS
5.0000 mg | ORAL_TABLET | ORAL | Status: DC | PRN
  Administered 2014-09-24: 5 mg via ORAL
  Administered 2014-09-25 – 2014-09-26 (×5): 10 mg via ORAL
  Filled 2014-09-24 (×6): qty 2

## 2014-09-24 MED ORDER — PROMETHAZINE HCL 25 MG/ML IJ SOLN: 6.2500 mg | INTRAMUSCULAR | Status: DC | PRN

## 2014-09-24 MED ORDER — POLYETHYLENE GLYCOL 3350 17 G PO PACK: 17.0000 g | PACK | Freq: Every day | ORAL | Status: DC | PRN

## 2014-09-24 MED ORDER — HYDROCHLOROTHIAZIDE 25 MG PO TABS
25.0000 mg | ORAL_TABLET | Freq: Every day | ORAL | Status: DC
  Administered 2014-09-25 – 2014-09-26 (×2): 25 mg via ORAL
  Filled 2014-09-24 (×2): qty 1

## 2014-09-24 MED ORDER — HYDROMORPHONE HCL 1 MG/ML IJ SOLN
INTRAMUSCULAR | Status: AC
  Administered 2014-09-24: 0.5 mg via INTRAVENOUS
  Filled 2014-09-24: qty 1

## 2014-09-24 MED ORDER — ROSUVASTATIN CALCIUM 10 MG PO TABS
10.0000 mg | ORAL_TABLET | Freq: Every day | ORAL | Status: DC
  Administered 2014-09-25: 10 mg via ORAL
  Filled 2014-09-24: qty 1

## 2014-09-24 MED ORDER — METOCLOPRAMIDE HCL 5 MG PO TABS: 5.0000 mg | ORAL_TABLET | Freq: Three times a day (TID) | ORAL | Status: DC | PRN

## 2014-09-24 MED ORDER — METHOCARBAMOL 500 MG PO TABS
500.0000 mg | ORAL_TABLET | Freq: Four times a day (QID) | ORAL | Status: DC | PRN
Start: 2014-09-24 — End: 2014-09-26
  Filled 2014-09-24: qty 1

## 2014-09-24 MED ORDER — ACETAMINOPHEN 650 MG RE SUPP: 650.0000 mg | Freq: Four times a day (QID) | RECTAL | Status: DC | PRN

## 2014-09-24 MED ORDER — PHENOL 1.4 % MT LIQD: 1.0000 | OROMUCOSAL | Status: DC | PRN

## 2014-09-24 MED ORDER — PROPOFOL 10 MG/ML IV BOLUS
INTRAVENOUS | Status: AC
  Filled 2014-09-24: qty 20

## 2014-09-24 MED ORDER — DIPHENHYDRAMINE HCL 12.5 MG/5ML PO ELIX: 12.5000 mg | ORAL_SOLUTION | ORAL | Status: DC | PRN

## 2014-09-24 MED ORDER — CEFAZOLIN SODIUM-DEXTROSE 2-3 GM-% IV SOLR
INTRAVENOUS | Status: AC
  Administered 2014-09-24: 2 g via INTRAVENOUS
  Filled 2014-09-24: qty 50

## 2014-09-24 MED ORDER — ONDANSETRON HCL 4 MG/2ML IJ SOLN: 4.0000 mg | Freq: Four times a day (QID) | INTRAMUSCULAR | Status: DC | PRN

## 2014-09-24 MED ORDER — DEXAMETHASONE SODIUM PHOSPHATE 10 MG/ML IJ SOLN
10.0000 mg | Freq: Once | INTRAMUSCULAR | Status: AC
  Administered 2014-09-25: 10 mg via INTRAVENOUS
  Filled 2014-09-24: qty 1

## 2014-09-24 MED ORDER — FENTANYL CITRATE (PF) 100 MCG/2ML IJ SOLN
INTRAMUSCULAR | Status: AC
  Filled 2014-09-24: qty 2

## 2014-09-24 MED ORDER — ONDANSETRON HCL 4 MG/2ML IJ SOLN
INTRAMUSCULAR | Status: DC | PRN
  Administered 2014-09-24: 4 mg via INTRAVENOUS

## 2014-09-24 MED ORDER — HYDROCODONE-ACETAMINOPHEN 7.5-325 MG PO TABS: 1.0000 | ORAL_TABLET | ORAL | Status: DC | PRN

## 2014-09-24 MED ORDER — LACTATED RINGERS IV SOLN
INTRAVENOUS | Status: DC
  Administered 2014-09-24: 11:00:00 via INTRAVENOUS

## 2014-09-24 MED ORDER — CEFAZOLIN SODIUM-DEXTROSE 2-3 GM-% IV SOLR: 2.0000 g | INTRAVENOUS | Status: DC

## 2014-09-24 MED ORDER — ACETAMINOPHEN 325 MG PO TABS: 650.0000 mg | ORAL_TABLET | Freq: Four times a day (QID) | ORAL | Status: DC | PRN

## 2014-09-24 MED ORDER — LEVOTHYROXINE SODIUM 125 MCG PO TABS
125.0000 ug | ORAL_TABLET | Freq: Every day | ORAL | Status: DC
  Administered 2014-09-25 – 2014-09-26 (×2): 125 ug via ORAL
  Filled 2014-09-24 (×2): qty 1

## 2014-09-24 MED ORDER — METHOCARBAMOL 1000 MG/10ML IJ SOLN
500.0000 mg | Freq: Four times a day (QID) | INTRAVENOUS | Status: DC | PRN
  Filled 2014-09-24: qty 5

## 2014-09-24 MED ORDER — FENTANYL CITRATE (PF) 250 MCG/5ML IJ SOLN
INTRAMUSCULAR | Status: AC
  Filled 2014-09-24: qty 5

## 2014-09-24 MED ORDER — SODIUM CHLORIDE 0.9 % IR SOLN
Status: DC | PRN
  Administered 2014-09-24: 3000 mL

## 2014-09-24 SURGICAL SUPPLY — 64 items
BANDAGE ELASTIC 4 VELCRO ST LF (GAUZE/BANDAGES/DRESSINGS) ×2 IMPLANT
BANDAGE ELASTIC 6 VELCRO ST LF (GAUZE/BANDAGES/DRESSINGS) ×2 IMPLANT
BANDAGE ESMARK 6X9 LF (GAUZE/BANDAGES/DRESSINGS) ×1 IMPLANT
BENZOIN TINCTURE PRP APPL 2/3 (GAUZE/BANDAGES/DRESSINGS) ×2 IMPLANT
BLADE SAG 18X100X1.27 (BLADE) ×4 IMPLANT
BNDG ESMARK 6X9 LF (GAUZE/BANDAGES/DRESSINGS) ×2
BOWL SMART MIX CTS (DISPOSABLE) ×2 IMPLANT
CAPT KNEE TOTAL 3 ×2 IMPLANT
CEMENT BONE SIMPLEX SPEEDSET (Cement) ×4 IMPLANT
CLSR STERI-STRIP ANTIMIC 1/2X4 (GAUZE/BANDAGES/DRESSINGS) ×2 IMPLANT
COVER SURGICAL LIGHT HANDLE (MISCELLANEOUS) ×2 IMPLANT
CUFF TOURNIQUET SINGLE 34IN LL (TOURNIQUET CUFF) ×2 IMPLANT
DRAPE EXTREMITY T 121X128X90 (DRAPE) ×2 IMPLANT
DRAPE IMP U-DRAPE 54X76 (DRAPES) ×2 IMPLANT
DRAPE PROXIMA HALF (DRAPES) ×2 IMPLANT
DRAPE U-SHAPE 47X51 STRL (DRAPES) ×2 IMPLANT
DRSG PAD ABDOMINAL 8X10 ST (GAUZE/BANDAGES/DRESSINGS) ×2 IMPLANT
DURAPREP 26ML APPLICATOR (WOUND CARE) ×2 IMPLANT
ELECT CAUTERY BLADE 6.4 (BLADE) ×2 IMPLANT
ELECT REM PT RETURN 9FT ADLT (ELECTROSURGICAL) ×2
ELECTRODE REM PT RTRN 9FT ADLT (ELECTROSURGICAL) ×1 IMPLANT
EVACUATOR 1/8 PVC DRAIN (DRAIN) ×2 IMPLANT
FACESHIELD WRAPAROUND (MASK) ×4 IMPLANT
GAUZE SPONGE 4X4 12PLY STRL (GAUZE/BANDAGES/DRESSINGS) ×2 IMPLANT
GLOVE BIOGEL PI IND STRL 7.0 (GLOVE) ×1 IMPLANT
GLOVE BIOGEL PI INDICATOR 7.0 (GLOVE) ×1
GLOVE ORTHO TXT STRL SZ7.5 (GLOVE) ×2 IMPLANT
GLOVE SURG ORTHO 7.0 STRL STRW (GLOVE) ×2 IMPLANT
GOWN STRL REUS W/ TWL LRG LVL3 (GOWN DISPOSABLE) ×2 IMPLANT
GOWN STRL REUS W/ TWL XL LVL3 (GOWN DISPOSABLE) ×1 IMPLANT
GOWN STRL REUS W/TWL LRG LVL3 (GOWN DISPOSABLE) ×2
GOWN STRL REUS W/TWL XL LVL3 (GOWN DISPOSABLE) ×1
HANDPIECE INTERPULSE COAX TIP (DISPOSABLE) ×1
IMMOBILIZER KNEE 22 (SOFTGOODS) ×2 IMPLANT
IMMOBILIZER KNEE 22 UNIV (SOFTGOODS) ×2 IMPLANT
IMMOBILIZER KNEE 24 THIGH 36 (MISCELLANEOUS) IMPLANT
IMMOBILIZER KNEE 24 UNIV (MISCELLANEOUS)
KIT BASIN OR (CUSTOM PROCEDURE TRAY) ×2 IMPLANT
KIT ROOM TURNOVER OR (KITS) ×2 IMPLANT
MANIFOLD NEPTUNE II (INSTRUMENTS) ×2 IMPLANT
NEEDLE 18GX1X1/2 (RX/OR ONLY) (NEEDLE) ×2 IMPLANT
NEEDLE HYPO 25GX1X1/2 BEV (NEEDLE) ×2 IMPLANT
NS IRRIG 1000ML POUR BTL (IV SOLUTION) ×2 IMPLANT
PACK TOTAL JOINT (CUSTOM PROCEDURE TRAY) ×2 IMPLANT
PACK UNIVERSAL I (CUSTOM PROCEDURE TRAY) ×2 IMPLANT
PAD ARMBOARD 7.5X6 YLW CONV (MISCELLANEOUS) ×4 IMPLANT
PAD CAST 4YDX4 CTTN HI CHSV (CAST SUPPLIES) ×1 IMPLANT
PADDING CAST COTTON 4X4 STRL (CAST SUPPLIES) ×1
PADDING CAST COTTON 6X4 STRL (CAST SUPPLIES) ×2 IMPLANT
SET HNDPC FAN SPRY TIP SCT (DISPOSABLE) ×1 IMPLANT
STRIP CLOSURE SKIN 1/2X4 (GAUZE/BANDAGES/DRESSINGS) ×4 IMPLANT
SUCTION FRAZIER TIP 10 FR DISP (SUCTIONS) IMPLANT
SUT MNCRL AB 4-0 PS2 18 (SUTURE) ×2 IMPLANT
SUT VIC AB 0 CT1 27 (SUTURE)
SUT VIC AB 0 CT1 27XBRD ANBCTR (SUTURE) IMPLANT
SUT VIC AB 1 CTX 36 (SUTURE) ×2
SUT VIC AB 1 CTX36XBRD ANBCTR (SUTURE) ×2 IMPLANT
SUT VIC AB 2-0 CT1 27 (SUTURE) ×2
SUT VIC AB 2-0 CT1 TAPERPNT 27 (SUTURE) ×2 IMPLANT
SYR 50ML LL SCALE MARK (SYRINGE) ×2 IMPLANT
SYR CONTROL 10ML LL (SYRINGE) ×2 IMPLANT
TOWEL OR 17X24 6PK STRL BLUE (TOWEL DISPOSABLE) ×2 IMPLANT
TOWEL OR 17X26 10 PK STRL BLUE (TOWEL DISPOSABLE) ×2 IMPLANT
WATER STERILE IRR 1000ML POUR (IV SOLUTION) IMPLANT

## 2014-09-24 NOTE — Progress Notes (Signed)
Patient admitted to room 5N21. +2 pulses, right lower extremity is cool, dry, patient states to alteration in sensation, no numbness or tingling, CPM on, no drain present. Patient is alert and oriented x4, patient oriented to room, unit, staff. Will continue to monitor.

## 2014-09-24 NOTE — Anesthesia Procedure Notes (Signed)
Procedure Name: LMA Insertion Performed by: Mariea Clonts Patient Re-evaluated:Patient Re-evaluated prior to inductionOxygen Delivery Method: Circle system utilized Preoxygenation: Pre-oxygenation with 100% oxygen LMA: LMA inserted LMA Size: 4.0 Number of attempts: 1 Placement Confirmation: positive ETCO2 and breath sounds checked- equal and bilateral Tube secured with: Tape Dental Injury: Teeth and Oropharynx as per pre-operative assessment

## 2014-09-24 NOTE — Anesthesia Postprocedure Evaluation (Signed)
  Anesthesia Post-op Note  Patient: [name]  Procedure(s) Performed: Procedure(s): TOTAL KNEE ARTHROPLASTY (Right)  Patient Location: PACU  Anesthesia Type:General  Level of Consciousness: awake, alert , oriented and patient cooperative  Airway and Oxygen Therapy: Patient Spontanous Breathing and Patient connected to nasal cannula oxygen  Post-op Pain: mild  Post-op Assessment: Post-op Vital signs reviewed, Patient's Cardiovascular Status Stable, Respiratory Function Stable, Patent Airway, No signs of Nausea or vomiting and Pain level controlled     RLE Motor Response: Responds to commands RLE Sensation: Full sensation      Post-op Vital Signs: Reviewed and stable  Last Vitals:  Filed Vitals:   09/24/14 1500  BP:   Pulse: 62  Temp:   Resp: 16    Complications: No apparent anesthesia complications

## 2014-09-24 NOTE — Interval H&P Note (Signed)
History and Physical Interval Note:  09/24/2014 8:19 AM  [name]  has presented today for surgery, with the diagnosis of DJD RIGHT KNEE  The various methods of treatment have been discussed with the patient and family. After consideration of risks, benefits and other options for treatment, the patient has consented to  Procedure(s): TOTAL KNEE ARTHROPLASTY (Right) as a surgical intervention .  The patient's history has been reviewed, patient examined, no change in status, stable for surgery.  I have reviewed the patient's chart and labs.  Questions were answered to the patient's satisfaction.     Ninetta Lights

## 2014-09-24 NOTE — Progress Notes (Signed)
Report given to dadid rees rn as caregiver

## 2014-09-24 NOTE — Transfer of Care (Signed)
Immediate Anesthesia Transfer of Care Note  Patient: [name]  Procedure(s) Performed: Procedure(s): TOTAL KNEE ARTHROPLASTY (Right)  Patient Location: PACU  Anesthesia Type:General  Level of Consciousness: awake, alert  and oriented  Airway & Oxygen Therapy: Patient Spontanous Breathing and Patient connected to nasal cannula oxygen  Post-op Assessment: Report given to RN and Post -op Vital signs reviewed and stable  Post vital signs: Reviewed and stable  Last Vitals:  Filed Vitals:   09/24/14 0942  BP: 201/75  Pulse: 62  Temp: 36.9 C  Resp: 20    Complications: No apparent anesthesia complications

## 2014-09-24 NOTE — Progress Notes (Signed)
Orthopedic Tech Progress Note Patient Details:  [name] 1933/11/22 [id number] Applied CPM to RLE.  Applied OHF with trapeze to pt.'s bed.   Left Bone Foam with pt.'s nurse. CPM Right Knee CPM Right Knee: On Right Knee Flexion (Degrees): 90 Right Knee Extension (Degrees): 0   Darrol Poke 09/24/2014, 2:41 PM

## 2014-09-24 NOTE — Discharge Summary (Addendum)
Patient ID: [name] MRN: [id number] DOB/AGE: 04/09/1933 81 y.o.  Admit date: 09/24/2014 Discharge date: 09/26/2014  Admission Diagnoses:  Active Problems:   DJD (degenerative joint disease) of knee   Discharge Diagnoses:  Same  Past Medical History  Diagnosis Date  . Hypertension   . Arthritis   . Nocturia   . Hyperlipemia   . Hypothyroidism   . Anemia     hx -2013 blood transfusion  polyps    Surgeries: Procedure(s): TOTAL KNEE ARTHROPLASTY on 09/24/2014   Consultants:    Discharged Condition: Improved  Hospital Course: [name] is an 81 y.o. female who was admitted 09/24/2014 for operative treatment of primary localized osteoarthritis right knee. Patient has severe unremitting pain that affects sleep, daily activities, and work/hobbies. After pre-op clearance the patient was taken to the operating room on 09/24/2014 and underwent  Procedure(s): TOTAL KNEE ARTHROPLASTY.  Patient with a  Hb of 14.1 developed ABLA on pod #1 with a Hb of 10.5 and 9.7 on pod#2.  Patient is currently stable but we will continue to follow.  Patient was given perioperative antibiotics:      Anti-infectives    Start     Dose/Rate Route Frequency Ordered Stop   09/24/14 1830  ceFAZolin (ANCEF) IVPB 1 g/50 mL premix     1 g 100 mL/hr over 30 Minutes Intravenous Every 6 hours 09/24/14 1659 09/25/14 0629   09/24/14 1115  ceFAZolin (ANCEF) IVPB 2 g/50 mL premix  Status:  Discontinued     2 g 100 mL/hr over 30 Minutes Intravenous On call to O.R. 09/24/14 1052 09/24/14 1643   09/24/14 1054  ceFAZolin (ANCEF) 2-3 GM-% IVPB SOLR    Comments:  Forte, Lindsi   : cabinet override      09/24/14 1054 09/24/14 1143       Patient was given sequential compression devices, early ambulation, and chemoprophylaxis to prevent DVT.  Patient benefited maximally from hospital stay and there were no complications.    Recent vital signs:  Patient Vitals for the past 24 hrs:  BP Temp Pulse  Resp SpO2  09/26/14 0600 136/61 mmHg 97.5 F (36.4 C) 70 16 100 %  09/25/14 2010 140/63 mmHg 98.3 F (36.8 C) 80 16 98 %  09/25/14 1539 137/62 mmHg 99.8 F (37.7 C) 78 16 -     Recent laboratory studies:   Recent Labs  09/25/14 0808 09/26/14 0524  WBC 8.2 12.0*  HGB 10.5* 9.7*  HCT 31.6* 28.6*  PLT 153 127*  NA 138 137  K 3.9 4.0  CL 108 107  CO2 24 25  BUN 12 13  CREATININE 1.02* 0.94  GLUCOSE 150* 140*  CALCIUM 8.6* 9.0     Discharge Medications:     Medication List    STOP taking these medications        cyclobenzaprine 10 MG tablet  Commonly known as:  FLEXERIL     HYDROcodone-acetaminophen 5-325 MG per tablet  Commonly known as:  NORCO/VICODIN  Replaced by:  HYDROcodone-acetaminophen 7.5-325 MG per tablet     naproxen 500 MG tablet  Commonly known as:  NAPROSYN     penicillin v potassium 500 MG tablet  Commonly known as:  VEETID      TAKE these medications        apixaban 2.5 MG Tabs tablet  Commonly known as:  ELIQUIS  Take 1 tab q 12 hours x 14 days following surgery to prevent blood clots  bisacodyl 5 MG EC tablet  Commonly known as:  DULCOLAX  Take 1 tablet (5 mg total) by mouth daily as needed for moderate constipation.     celecoxib 200 MG capsule  Commonly known as:  CELEBREX  Take 200 mg by mouth daily.     hydrochlorothiazide 25 MG tablet  Commonly known as:  HYDRODIURIL  Take 25 mg by mouth daily.     HYDROcodone-acetaminophen 7.5-325 MG per tablet  Commonly known as:  NORCO  Take 1-2 tablets by mouth every 4 (four) hours as needed for moderate pain.     levothyroxine 125 MCG tablet  Commonly known as:  SYNTHROID, LEVOTHROID  Take 125 mcg by mouth daily.     ondansetron 4 MG tablet  Commonly known as:  ZOFRAN  Take 1 tablet (4 mg total) by mouth every 8 (eight) hours as needed for nausea or vomiting.     PHILLIPS STOOL SOFTENER 100 MG capsule  Generic drug:  docusate sodium  Take 100 mg by mouth 2 (two) times daily.      rosuvastatin 10 MG tablet  Commonly known as:  CRESTOR  Take 10 mg by mouth daily.     tiZANidine 2 MG tablet  Commonly known as:  ZANAFLEX  Take 2 mg by mouth every 8 (eight) hours as needed. Muscle spasm.        Diagnostic Studies: Dg Knee Right Port  09/24/2014   CLINICAL DATA:  Patient status post total right knee arthroplasty.  EXAM: PORTABLE RIGHT KNEE - 1-2 VIEW  COMPARISON:  None.  FINDINGS: Total right knee arthroplasty is demonstrated. There is gas within the overlying soft tissues compatible with postoperative state. Small joint effusion. Hardware appears in appropriate position. No acute osseous abnormality.  IMPRESSION: Patient status post total right knee arthroplasty without evidence for acute osseous abnormality.   Electronically Signed   By: Lovey Newcomer M.D.   On: 09/24/2014 14:04    Disposition: 01-Home or Self Care    Follow-up Information    Follow up with Ninetta Lights, MD. Schedule an appointment as soon as possible for a visit in 2 weeks.   Specialty:  Orthopedic Surgery   Contact information:   [place] [address] [phone]       Follow up with [facility].   Why:  They will contact you to schedule home therapy visits.   Contact information:   [address] [place] [address] [phone]        Signed: [name] 09/26/2014, 7:19 AM

## 2014-09-24 NOTE — Anesthesia Preprocedure Evaluation (Addendum)
Anesthesia Evaluation  Patient identified by MRN, date of birth, ID band Patient awake    Reviewed: Allergy & Precautions, NPO status , Patient's Chart, lab work & pertinent test results  History of Anesthesia Complications Negative for: history of anesthetic complications  Airway Mallampati: II  TM Distance: >3 FB Neck ROM: Full    Dental  (+) Dental Advisory Given   Pulmonary COPD,    breath sounds clear to auscultation       Cardiovascular hypertension, Pt. on medications (-) angina Rhythm:Regular Rate:Normal     Neuro/Psych Chronic back pain    GI/Hepatic negative GI ROS, Neg liver ROS,   Endo/Other  Hypothyroidism   Renal/GU negative Renal ROS     Musculoskeletal  (+) Arthritis , Osteoarthritis,    Abdominal   Peds  Hematology negative hematology ROS (+)   Anesthesia Other Findings   Reproductive/Obstetrics                           Anesthesia Physical Anesthesia Plan  ASA: III  Anesthesia Plan: General   Post-op Pain Management:    Induction: Intravenous  Airway Management Planned: LMA  Additional Equipment:   Intra-op Plan:   Post-operative Plan:   Informed Consent: I have reviewed the patients History and Physical, chart, labs and discussed the procedure including the risks, benefits and alternatives for the proposed anesthesia with the patient or authorized representative who has indicated his/her understanding and acceptance.   Dental advisory given  Plan Discussed with: CRNA and Surgeon  Anesthesia Plan Comments: (Plan routine monitors, GA- LMA OK)        Anesthesia Quick Evaluation

## 2014-09-24 NOTE — H&P (View-Only) (Signed)
TOTAL KNEE ADMISSION H&P  Patient is being admitted for right total knee arthroplasty.  Subjective:  Chief Complaint:right knee pain.  HPI: [name], 81 y.o. female, has a history of pain and functional disability in the right knee due to arthritis and has failed non-surgical conservative treatments for greater than 12 weeks to includeNSAID's and/or analgesics and corticosteriod injections.  Onset of symptoms was gradual, starting 2 years ago with gradually worsening course since that time. The patient noted no past surgery on the right knee(s).  Patient currently rates pain in the right knee(s) at 6 out of 10 with activity. Patient has night pain and worsening of pain with activity and weight bearing.  Patient has evidence of subchondral sclerosis and joint space narrowing by imaging. There is no active infection.  Patient Active Problem List   Diagnosis Date Noted  . COLONIC POLYPS, ADENOMATOUS, HX OF 10/07/2008  . HYPOTHYROIDISM 10/06/2008  . HYPERCHOLESTEROLEMIA 10/06/2008  . DEPRESSION 10/06/2008  . HYPERTENSION 10/06/2008  . HEMORRHOIDS 10/06/2008  . DIVERTICULAR DISEASE 10/06/2008  . OSTEOPOROSIS 10/06/2008  . DYSPNEA 10/06/2008  . CHEST DISCOMFORT 10/06/2008  . COLONOSCOPY, HX OF 10/06/2008  . ARTHROSCOPY, RIGHT KNEE, HX OF 10/06/2008   Past Medical History  Diagnosis Date  . Hypertension   . Arthritis   . Nocturia   . Hyperlipemia     Past Surgical History  Procedure Laterality Date  . Fracture surgery      left leg  rod  . Joint replacement      hip  . Knee arthroscopy    . Lumbar laminectomy/decompression microdiscectomy Right 04/10/2012    Procedure: LUMBAR LAMINECTOMY/DECOMPRESSION MICRODISCECTOMY 1 LEVEL;  Surgeon: Joseph Stern, MD;  Location: [facility];  Service: Neurosurgery;  Laterality: Right;  Right Lumbar three - four Microdiskectomy     (Not in a hospital admission) No Known Allergies  Social History  Substance Use Topics  . Smoking  status: Never Smoker   . Smokeless tobacco: Not on file  . Alcohol Use: No    No family history on file.   Review of Systems  Constitutional: Negative.   HENT: Negative.   Eyes: Negative.   Respiratory: Negative.   Cardiovascular: Negative.   Gastrointestinal: Negative.   Genitourinary: Negative.   Musculoskeletal: Positive for joint pain.  Skin: Negative.   Neurological: Negative.   Endo/Heme/Allergies: Negative.   Psychiatric/Behavioral: Negative.     Objective:  Physical Exam  Constitutional: She is oriented to person, place, and time. She appears well-developed and well-nourished.  HENT:  Head: Normocephalic and atraumatic.  Eyes: EOM are normal. Pupils are equal, round, and reactive to light.  Neck: Normal range of motion. Neck supple.  Cardiovascular: Normal rate and regular rhythm.   Respiratory: Effort normal and breath sounds normal. No respiratory distress. She has no wheezes.  GI: Soft. Bowel sounds are normal.  Musculoskeletal:  Examination of her right knee reveals a 1+ effusion.  Range of motion 0-110 degrees.  Marked patellofemoral crepitus.  Minimal joint line tenderness.  Positive straight leg raise.  Negative log roll.    Neurological: She is alert and oriented to person, place, and time.  Skin: Skin is warm and dry.  Psychiatric: She has a normal mood and affect. Her behavior is normal. Judgment and thought content normal.    Vital signs in last 24 hours: @VSRANGES@  Labs:   Estimated body mass index is 27.07 kg/(m^2) as calculated from the following:   Height as of 02/28/14: 5' 8" (1.727   m).   Weight as of 02/28/14: 80.74 kg (178 lb).   Imaging Review Plain radiographs demonstrate severe degenerative joint disease of the right knee(s). The overall alignment ismild varus. The bone quality appears to be fair for age and reported activity level.  Assessment/Plan:  End stage arthritis, right knee   The patient history, physical examination,  clinical judgment of the provider and imaging studies are consistent with end stage degenerative joint disease of the right knee(s) and total knee arthroplasty is deemed medically necessary. The treatment options including medical management, injection therapy arthroscopy and arthroplasty were discussed at length. The risks and benefits of total knee arthroplasty were presented and reviewed. The risks due to aseptic loosening, infection, stiffness, patella tracking problems, thromboembolic complications and other imponderables were discussed. The patient acknowledged the explanation, agreed to proceed with the plan and consent was signed. Patient is being admitted for inpatient treatment for surgery, pain control, PT, OT, prophylactic antibiotics, VTE prophylaxis, progressive ambulation and ADL's and discharge planning. The patient is planning to be discharged home with home health services   

## 2014-09-25 ENCOUNTER — Encounter (HOSPITAL_COMMUNITY): Payer: Self-pay | Admitting: Orthopedic Surgery

## 2014-09-25 LAB — BASIC METABOLIC PANEL
Anion gap: 6 (ref 5–15)
BUN: 12 mg/dL (ref 6–20)
CHLORIDE: 108 mmol/L (ref 101–111)
CO2: 24 mmol/L (ref 22–32)
CREATININE: 1.02 mg/dL — AB (ref 0.44–1.00)
Calcium: 8.6 mg/dL — ABNORMAL LOW (ref 8.9–10.3)
GFR calc Af Amer: 59 mL/min — ABNORMAL LOW (ref >60)
GFR calc non Af Amer: 51 mL/min — ABNORMAL LOW (ref >60)
Glucose, Bld: 150 mg/dL — ABNORMAL HIGH (ref 65–99)
Potassium: 3.9 mmol/L (ref 3.5–5.1)
Sodium: 138 mmol/L (ref 135–145)

## 2014-09-25 LAB — CBC
HEMATOCRIT: 31.6 % — AB (ref 36.0–46.0)
HEMOGLOBIN: 10.5 g/dL — AB (ref 12.0–15.0)
MCH: 32.1 pg (ref 26.0–34.0)
MCHC: 33.2 g/dL (ref 30.0–36.0)
MCV: 96.6 fL (ref 78.0–100.0)
Platelets: 153 10*3/uL (ref 150–400)
RBC: 3.27 MIL/uL — ABNORMAL LOW (ref 3.87–5.11)
RDW: 13.3 % (ref 11.5–15.5)
WBC: 8.2 10*3/uL (ref 4.0–10.5)

## 2014-09-25 NOTE — Progress Notes (Signed)
Orthopedic Tech Progress Note Patient Details:  [name] Aug 07, 1933 [id number] On cpm at 6:30 pm Patient ID: [name], female   DOB: March 29, 1933, 81 y.o.   MRN: [id number]   Braulio Bosch 09/25/2014, 6:39 PM

## 2014-09-25 NOTE — Progress Notes (Signed)
Subjective: 1 Day Post-Op Procedure(s) (LRB): TOTAL [name] (Right) Patient reports pain as mild.  Patient resting comfortably in bed.  Daughter at bedside.  No nausea/vomiting, lightheadedness/dizziness, chest pain/sob.  Objective: Vital signs in last 24 hours: Temp:  [97.4 F (36.3 C)-99.2 F (37.3 C)] 98.3 F (36.8 C) (09/08 0521) Pulse Rate:  [62-87] 72 (09/08 0521) Resp:  [12-20] 16 (09/08 0521) BP: (108-201)/(57-95) 140/72 mmHg (09/08 0521) SpO2:  [98 %-100 %] 98 % (09/08 0521) Weight:  [76.488 kg (168 lb 10 oz)] 76.488 kg (168 lb 10 oz) (09/07 0942)  Intake/Output from previous day: 09/07 0701 - 09/08 0700 In: 1000 [I.V.:1000] Out: 225 [Urine:225] Intake/Output this shift:    No results for input(s): HGB in the last 72 hours. No results for input(s): WBC, RBC, HCT, PLT in the last 72 hours. No results for input(s): NA, K, CL, CO2, BUN, CREATININE, GLUCOSE, CALCIUM in the last 72 hours. No results for input(s): LABPT, INR in the last 72 hours.  Neurologically intact Neurovascular intact Sensation intact distally Dorsiflexion/Plantar flexion intact Compartment soft  Negative homans bilaterally  Assessment/Plan: 1 Day Post-Op Procedure(s) (LRB): TOTAL [name] (Right) Advance diet Up with therapy Discharge home with home health most likely tomorrow WBAT RLE Dry dressing change prn  [name] 09/25/2014, 6:53 AM

## 2014-09-25 NOTE — Op Note (Signed)
NAME:  [name], [name] NO.:  [id number]  MEDICAL RECORD NO.:  [id number]  LOCATION:  5N21C                        FACILITY:  [facility]  PHYSICIAN:  Ninetta Lights, M.D. DATE OF BIRTH:  1933/06/02  DATE OF PROCEDURE:  09/24/2014 DATE OF DISCHARGE:                              OPERATIVE REPORT   PREOPERATIVE DIAGNOSIS:  Right knee end-stage arthritis most marked lateral compartment. Primary generalized.  POSTOPERATIVE DIAGNOSIS:  Right knee end-stage arthritis most marked lateral compartment. Primary generalized.  PROCEDURE:  Right knee modified minimally invasive total knee replacement with Stryker triathlon prosthesis.  Soft tissue balancing. A cemented pegged #3 cruciate retaining femoral component.  Cemented #4 tibial component, 9-mm polyethylene insert.  Cemented resurfacing 35-mm patellar component.  Of note, she had moderate osteopenia throughout the entire knee most marked on the tibia.  SURGEON:  Ninetta Lights, M.D.  ASSISTANT:  Elmyra Ricks, PA present throughout the entire case and necessary for timely completion of procedure.  ANESTHESIA:  General.  BLOOD LOSS:  Minimal.  SPECIMENS:  None.  CULTURES:  None.  COMPLICATION:  None.  DRESSINGS:  Soft compressive knee immobilizer.  DRAINS:  Hemovac x1.  TOURNIQUET TIME:  50 minutes.  DESCRIPTION OF PROCEDURE:  The patient was brought to the operating room, placed on the operating table in supine position.  After adequate anesthesia had been obtained, tourniquet applied, prepped and draped in usual sterile fashion.  Exsanguinated with elevation of Esmarch, tourniquet inflated to 350 mmHg.  A longitudinal incision above the patella down the tibial tubercle.  Skin and subcutaneous tissue divided. Hemostasis with cautery.  Medial arthrotomy vastus splitting preserving quad tendon.  Medial capsular release.  Flexible intramedullary guide distal femur.  An 8-mm resection most of the bone  loss taken off the medial side because of deficiency and bone loss laterally.  Using epicondylar axis, the femur was sized, cut, and fitted for a pegged cruciate retaining #3 component.  Extramedullary guide proximal tibial resection, a 3-degree posterior slope cut.  Size #4 component.  More osteopenic in the tibia than in the femur.  Patella exposed, posterior 10 mm removed.  Drilled, sized, and fitted for a 35-mm component. Trials put in place.  With the 9-mm insert, I was pleased with balancing, motion, stability, flexion, extension, and patellar tracking. Tibia was marked for rotation and hand reamed.  All trials removed. Copious irrigation with pulse irrigating device.  Cement prepared, placed on all components.  Polyethylene attached to tibia, knee reduced. Patella with a clamp.  Once the cement hardened, the knee was irrigated once again.  Hemovac was placed and brought out through a separate stab wound.  Soft tissues had been injected with Exparel.  Arthrotomy closed with Ethibond.  Subcutaneous and subcuticular closure.  Margins were injected with Marcaine.  Sterile compressive dressing applied. Tourniquet deflated and removed.  Knee immobilizer applied.  Anesthesia reversed.  Brought to the recovery room.  Tolerated the surgery well. No complications.     Ninetta Lights, M.D.     DFM/MEDQ  D:  09/24/2014  T:  09/25/2014  Job:  [id number]

## 2014-09-25 NOTE — Progress Notes (Signed)
Physical Therapy Treatment Patient Details Name: [name] MRN: [id number] DOB: 06/22/1933 Today's Date: 09/25/2014    History of Present Illness TOTAL KNEE ARTHROPLASTY (Right)    PT Comments    Patient is making good progress with PT.  From a mobility standpoint anticipate patient will be ready for DC home with family assistance. Will need to demonstrate ability to go up/down stairs safely before D/C.      Follow Up Recommendations  Home health PT;Supervision for mobility/OOB     Equipment Recommendations  None recommended by PT;Other (comment)    Recommendations for Other Services       Precautions / Restrictions Precautions Precautions: Knee;Fall Precaution Booklet Issued: Yes (comment) Precaution Comments: HEP Restrictions Weight Bearing Restrictions: Yes RLE Weight Bearing: Weight bearing as tolerated    Mobility  Bed Mobility Overal bed mobility: Needs Assistance Bed Mobility: Supine to Sit     Supine to sit: Supervision;HOB elevated     General bed mobility comments: found and returned to chair, just got up  Transfers Overall transfer level: Needs assistance Equipment used: Rolling walker (2 wheeled) Transfers: Sit to/from Stand Sit to Stand: Min assist         General transfer comment: no cues needed for hand placement  Ambulation/Gait Ambulation/Gait assistance: Min guard;Supervision Ambulation Distance (Feet): 110 Feet Assistive device: Rolling walker (2 wheeled) Gait Pattern/deviations: Step-through pattern Gait velocity: decreased (mild)   General Gait Details: cues for even stride length and heel-toe pattern   Stairs            Wheelchair Mobility    Modified Rankin (Stroke Patients Only)       Balance Overall balance assessment: Needs assistance Sitting-balance support: No upper extremity supported Sitting balance-Leahy Scale: Good     Standing balance support: Single extremity supported Standing  balance-Leahy Scale: Poor                      Cognition Arousal/Alertness: Awake/alert Behavior During Therapy: WFL for tasks assessed/performed Overall Cognitive Status: Within Functional Limits for tasks assessed                      Exercises Total Joint Exercises Ankle Circles/Pumps: AROM;Both;15 reps Quad Sets: Strengthening;Right;10 reps Gluteal Sets: Strengthening;Both;10 reps Towel Squeeze: Both;5 reps;Strengthening Short Arc Quad: Right;Strengthening;10 reps Heel Slides: AAROM;Right;10 reps Straight Leg Raises: Strengthening;Right;10 reps (mild lag) Goniometric ROM: 82 degrees flexion    General Comments        Pertinent Vitals/Pain Pain Assessment: No/denies pain Pain Intervention(s): Limited activity within patient's tolerance;Monitored during session    [place] expects to be discharged to:: Private residence Living Arrangements: Spouse/significant other Available Help at Discharge: Family Type of Home: House Home Access: Stairs to enter Entrance Stairs-Rails:  (one entrance with and one entrance without) Home Layout: Able to live on main level with bedroom/bathroom Home Equipment: Walker - 2 wheels      Prior Function Level of Independence: Independent          PT Goals (current goals can now be found in the care plan section) Acute Rehab PT Goals Patient Stated Goal: go home tomorrow PT Goal Formulation: With patient Time For Goal Achievement: 10/09/14 Potential to Achieve Goals: Good Progress towards PT goals: Progressing toward goals    Frequency  7X/week    PT Plan Current plan remains appropriate    Co-evaluation             End of Session  Equipment Utilized During Treatment: Gait belt Activity Tolerance: Patient tolerated treatment well Patient left: in chair;with call bell/phone within reach     Time: 1329-1352 PT Time Calculation (min) (ACUTE ONLY): 23 min  Charges:  $Gait Training: 8-22  mins $Therapeutic Exercise: 8-22 mins                    G Codes:      Cassell Clement, PT, CSCS Pager [phone] Office [phone]  09/25/2014, 2:00 PM

## 2014-09-25 NOTE — Discharge Instructions (Signed)
INSTRUCTIONS AFTER JOINT REPLACEMENT   o Remove items at home which could result in a fall. This includes throw rugs or furniture in walking pathways o ICE to the affected joint every three hours while awake for 30 minutes at a time, for at least the first 3-5 days, and then as needed for pain and swelling.  Continue to use ice for pain and swelling. You may notice swelling that will progress down to the foot and ankle.  This is normal after surgery.  Elevate your leg when you are not up walking on it.   o Continue to use the breathing machine you got in the hospital (incentive spirometer) which will help keep your temperature down.  It is common for your temperature to cycle up and down following surgery, especially at night when you are not up moving around and exerting yourself.  The breathing machine keeps your lungs expanded and your temperature down.  BLOOD THINNER: TAKE ELIQUIS AS DIRECTED FOR A TOTAL OF 14 DAYS FOLLOWING SURGERY.  ONCE FINISHED WITH ELIQUIS, TAKE ASPIRIN 325 MG ONE TAB ONCE DAILY FOR THE NEXT TWO WEEKS.  THESE MEDICATIONS ARE USED TO PREVENT BLOOD CLOTS.  DIET:  As you were doing prior to hospitalization, we recommend a well-balanced diet.  DRESSING / WOUND CARE / SHOWERING  You may change your dressing 3-5 days after surgery.  Then change the dressing every day with sterile gauze.  Please use good hand washing techniques before changing the dressing.  Do not use any lotions or creams on the incision until instructed by your surgeon. and You may shower 3 days after surgery, but keep the wounds dry during showering.  You may use an occlusive plastic wrap (Press'n Seal for example), NO SOAKING/SUBMERGING IN THE BATHTUB.  If the bandage gets wet, change with a clean dry gauze.  If the incision gets wet, pat the wound dry with a clean towel.  ACTIVITY  o Increase activity slowly as tolerated, but follow the weight bearing instructions below.   o No driving for 6 weeks or until  further direction given by your physician.  You cannot drive while taking narcotics.  o No lifting or carrying greater than 10 lbs. until further directed by your surgeon. o Avoid periods of inactivity such as sitting longer than an hour when not asleep. This helps prevent blood clots.  o You may return to work once you are authorized by your doctor.     WEIGHT BEARING   Weight bearing as tolerated with assist device (walker, cane, etc) as directed, use it as long as suggested by your surgeon or therapist, typically at least 4-6 weeks.   EXERCISES  Results after joint replacement surgery are often greatly improved when you follow the exercise, range of motion and muscle strengthening exercises prescribed by your doctor. Safety measures are also important to protect the joint from further injury. Any time any of these exercises cause you to have increased pain or swelling, decrease what you are doing until you are comfortable again and then slowly increase them. If you have problems or questions, call your caregiver or physical therapist for advice.   Rehabilitation is important following a joint replacement. After just a few days of immobilization, the muscles of the leg can become weakened and shrink (atrophy).  These exercises are designed to build up the tone and strength of the thigh and leg muscles and to improve motion. Often times heat used for twenty to thirty minutes before working out  will loosen up your tissues and help with improving the range of motion but do not use heat for the first two weeks following surgery (sometimes heat can increase post-operative swelling).   These exercises can be done on a training (exercise) mat, on the floor, on a table or on a bed. Use whatever works the best and is most comfortable for you.    Use music or television while you are exercising so that the exercises are a pleasant break in your day. This will make your life better with the exercises acting  as a break in your routine that you can look forward to.   Perform all exercises about fifteen times, three times per day or as directed.  You should exercise both the operative leg and the other leg as well.  Exercises include:    Quad Sets - Tighten up the muscle on the front of the thigh (Quad) and hold for 5-10 seconds.    Straight Leg Raises - With your knee straight (if you were given a brace, keep it on), lift the leg to 60 degrees, hold for 3 seconds, and slowly lower the leg.  Perform this exercise against resistance later as your leg gets stronger.   Leg Slides: Lying on your back, slowly slide your foot toward your buttocks, bending your knee up off the floor (only go as far as is comfortable). Then slowly slide your foot back down until your leg is flat on the floor again.   Angel Wings: Lying on your back spread your legs to the side as far apart as you can without causing discomfort.   Hamstring Strength:  Lying on your back, push your heel against the floor with your leg straight by tightening up the muscles of your buttocks.  Repeat, but this time bend your knee to a comfortable angle, and push your heel against the floor.  You may put a pillow under the heel to make it more comfortable if necessary.   A rehabilitation program following joint replacement surgery can speed recovery and prevent re-injury in the future due to weakened muscles. Contact your doctor or a physical therapist for more information on knee rehabilitation.    CONSTIPATION  Constipation is defined medically as fewer than three stools per week and severe constipation as less than one stool per week.  Even if you have a regular bowel pattern at home, your normal regimen is likely to be disrupted due to multiple reasons following surgery.  Combination of anesthesia, postoperative narcotics, change in appetite and fluid intake all can affect your bowels.   YOU MUST use at least one of the following options; they  are listed in order of increasing strength to get the job done.  They are all available over the counter, and you may need to use some, POSSIBLY even all of these options:    Drink plenty of fluids (prune juice may be helpful) and high fiber foods Colace 100 mg by mouth twice a day  Senokot for constipation as directed and as needed Dulcolax (bisacodyl), take with full glass of water  Miralax (polyethylene glycol) once or twice a day as needed.  If you have tried all these things and are unable to have a bowel movement in the first 3-4 days after surgery call either your surgeon or your primary doctor.    If you experience loose stools or diarrhea, hold the medications until you stool forms back up.  If your symptoms do not get better  within 1 week or if they get worse, check with your doctor.  If you experience "the worst abdominal pain ever" or develop nausea or vomiting, please contact the office immediately for further recommendations for treatment.   ITCHING:  If you experience itching with your medications, try taking only a single pain pill, or even half a pain pill at a time.  You can also use Benadryl over the counter for itching or also to help with sleep.   TED HOSE STOCKINGS:  Use stockings on both legs until for at least 2 weeks or as directed by physician office. They may be removed at night for sleeping.  MEDICATIONS:  See your medication summary on the After Visit Summary that nursing will review with you.  You may have some home medications which will be placed on hold until you complete the course of blood thinner medication.  It is important for you to complete the blood thinner medication as prescribed.  PRECAUTIONS:  If you experience chest pain or shortness of breath - call 911 immediately for transfer to the hospital emergency department.   If you develop a fever greater that 101 F, purulent drainage from wound, increased redness or drainage from wound, foul odor from the  wound/dressing, or calf pain - CONTACT YOUR SURGEON.                                                   FOLLOW-UP APPOINTMENTS:  If you do not already have a post-op appointment, please call the office for an appointment to be seen by your surgeon.  Guidelines for how soon to be seen are listed in your After Visit Summary, but are typically between 1-4 weeks after surgery.  OTHER INSTRUCTIONS:   Knee Replacement:  Do not place pillow under knee, focus on keeping the knee straight while resting. CPM instructions: 0-90 degrees, 2 hours in the morning, 2 hours in the afternoon, and 2 hours in the evening. Place foam block, curve side up under heel at all times except when in CPM or when walking.  DO NOT modify, tear, cut, or change the foam block in any way.  MAKE SURE YOU:   Understand these instructions.   Get help right away if you are not doing well or get worse.    Thank you for letting us be a part of your medical care team.  It is a privilege we respect greatly.  We hope these instructions will help you stay on track for a fast and full recovery!   Information on my medicine - ELIQUIS (apixaban)  This medication education was reviewed with me or my healthcare representative as part of my discharge preparation.   Why was Eliquis prescribed for you? Eliquis was prescribed for you to reduce the risk of blood clots forming after orthopedic surgery.    What do You need to know about Eliquis? Take your Eliquis TWICE DAILY - one tablet in the morning and one tablet in the evening with or without food.  It would be best to take the dose about the same time each day.  If you have difficulty swallowing the tablet whole please discuss with your pharmacist how to take the medication safely.  Take Eliquis exactly as prescribed by your doctor and DO NOT stop taking Eliquis without talking to the doctor who prescribed the  medication.  Stopping without other medication to take the place of  Eliquis may increase your risk of developing a clot.  After discharge, you should have regular check-up appointments with your healthcare provider that is prescribing your Eliquis.  What do you do if you miss a dose? If a dose of ELIQUIS is not taken at the scheduled time, take it as soon as possible on the same day and twice-daily administration should be resumed.  The dose should not be doubled to make up for a missed dose.  Do not take more than one tablet of ELIQUIS at the same time.  Important Safety Information A possible side effect of Eliquis is bleeding. You should call your healthcare provider right away if you experience any of the following: ? Bleeding from an injury or your nose that does not stop. ? Unusual colored urine (red or dark brown) or unusual colored stools (red or black). ? Unusual bruising for unknown reasons. ? A serious fall or if you hit your head (even if there is no bleeding).  Some medicines may interact with Eliquis and might increase your risk of bleeding or clotting while on Eliquis. To help avoid this, consult your healthcare provider or pharmacist prior to using any new prescription or non-prescription medications, including herbals, vitamins, non-steroidal anti-inflammatory drugs (NSAIDs) and supplements.  This website has more information on Eliquis (apixaban): http://www.eliquis.com/eliquis/home

## 2014-09-25 NOTE — Evaluation (Signed)
Physical Therapy Evaluation Patient Details Name: [name] MRN: [id number] DOB: 11-25-1933 Today's Date: 09/25/2014   History of Present Illness  TOTAL KNEE ARTHROPLASTY (Right)  Clinical Impression  Pt is s/p TKA resulting in the deficits listed below (see PT Problem List). Pt will benefit from skilled PT to increase their independence and safety with mobility to allow discharge to home with family assistance.      Follow Up Recommendations Home health PT;Supervision for mobility/OOB    Equipment Recommendations  None recommended by PT;Other (comment) (patient reports having all equipment needs met)    Recommendations for Other Services       Precautions / Restrictions Precautions Precautions: Knee;Fall Precaution Booklet Issued: Yes (comment) Precaution Comments: HEP Restrictions Weight Bearing Restrictions: Yes RLE Weight Bearing: Weight bearing as tolerated      Mobility  Bed Mobility Overal bed mobility: Needs Assistance Bed Mobility: Supine to Sit     Supine to sit: Supervision;HOB elevated     General bed mobility comments: using rail to assist with mobility  Transfers Overall transfer level: Needs assistance Equipment used: Rolling walker (2 wheeled) Transfers: Sit to/from Stand Sit to Stand: Min assist         General transfer comment: no cues needed for hand placement  Ambulation/Gait Ambulation/Gait assistance: Min guard Ambulation Distance (Feet): 30 Feet Assistive device: Rolling walker (2 wheeled) Gait Pattern/deviations: Step-to pattern;Decreased stance time - right;Decreased weight shift to right Gait velocity: decreased   General Gait Details: guarded weightbearing on Rt, encouraging gradual increase in wt bearing  Stairs            Wheelchair Mobility    Modified Rankin (Stroke Patients Only)       Balance Overall balance assessment: Needs assistance Sitting-balance support: No upper extremity  supported Sitting balance-Leahy Scale: Fair     Standing balance support: Bilateral upper extremity supported Standing balance-Leahy Scale: Poor                               Pertinent Vitals/Pain Pain Assessment: No/denies pain Pain Intervention(s): Monitored during session    Home Living Family/patient expects to be discharged to:: Private residence Living Arrangements: Spouse/significant other Available Help at Discharge: Family Type of Home: House Home Access: Stairs to enter Entrance Stairs-Rails:  (one entrance with and one entrance without) Entrance Stairs-Number of Steps: 2-6 (2 no rails, 6 with rails (alternate entrances)) Home Layout: Able to live on main level with bedroom/bathroom Home Equipment: Walker - 2 wheels      Prior Function Level of Independence: Independent               Hand Dominance        Extremity/Trunk Assessment               Lower Extremity Assessment: RLE deficits/detail RLE Deficits / Details: fair quad activation, lag with SLR       Communication   Communication: No difficulties  Cognition Arousal/Alertness: Awake/alert Behavior During Therapy: WFL for tasks assessed/performed Overall Cognitive Status: Within Functional Limits for tasks assessed                      General Comments      Exercises Total Joint Exercises Ankle Circles/Pumps: AROM;Both;15 reps Quad Sets: Strengthening;Right;10 reps Gluteal Sets: Strengthening;Both;10 reps Heel Slides: Right;AAROM;5 reps Goniometric ROM: 82 degrees flexion      Assessment/Plan    PT Assessment Patient needs  continued PT services  PT Diagnosis Difficulty walking;Abnormality of gait   PT Problem List Decreased strength;Decreased range of motion;Decreased activity tolerance;Decreased balance;Decreased mobility  PT Treatment Interventions DME instruction;Gait training;Stair training;Functional mobility training;Therapeutic  activities;Therapeutic exercise;Balance training;Patient/family education   PT Goals (Current goals can be found in the Care Plan section) Acute Rehab PT Goals Patient Stated Goal: go home upon D/C PT Goal Formulation: With patient Time For Goal Achievement: 10/09/14 Potential to Achieve Goals: Good    Frequency 7X/week   Barriers to discharge        Co-evaluation               End of Session Equipment Utilized During Treatment: Gait belt Activity Tolerance: Patient tolerated treatment well Patient left: in chair;with call bell/phone within reach;Other (comment) (in bone foam) Nurse Communication: Mobility status         Time: 7290-2111 PT Time Calculation (min) (ACUTE ONLY): 31 min   Charges:   PT Evaluation $Initial PT Evaluation Tier I: 1 Procedure PT Treatments $Gait Training: 8-22 mins   PT G Codes:        Cassell Clement, PT, CSCS Pager [phone] Office [phone]  09/25/2014, 10:15 AM

## 2014-09-25 NOTE — Progress Notes (Signed)
Utilization review completed.  

## 2014-09-25 NOTE — Progress Notes (Signed)
Patients daughter (self identified RN) asked RN about the length of time that her mother should be in the CPM machine. RN checked the order set and did not see a set increment of hours the patient is to attempt to acheive during a 24 hour period (RN relayed this message to patient and her daughter). Patient was placed on CPM around 1445 in PACU. RN removed patient from [place] at 2045. Patients daughter also wanted to inquire about patients Crestor medication and at what time she is to have the medication. RN informed the patient and her daughter that Crestor is a medication that has been scheduled for 1700. Patient and patients daughter thanked Therapist, sports. Nursing will continue to monitor.

## 2014-09-25 NOTE — Progress Notes (Signed)
Occupational Therapy Evaluation Patient Details Name: [name] MRN: [id number] DOB: 09/19/1933 Today's Date: 09/25/2014    History of Present Illness TOTAL KNEE ARTHROPLASTY (Right)   Clinical Impression   Completed all education regarding compensatory techniques for ADL and functional mobility for ADL with use of DME. Pt/family able to demonstrate understanding. Pt safe to D/C home with initial 24/7 S when medically stable. OT signing off.     Follow Up Recommendations  No OT follow up;Supervision/Assistance - 24 hour (initially)    Equipment Recommendations  None recommended by OT    Recommendations for Other Services       Precautions / Restrictions Precautions Precautions: Knee;Fall Restrictions Weight Bearing Restrictions: Yes RLE Weight Bearing: Weight bearing as tolerated      Mobility Bed Mobility               General bed mobility comments: Pt OOB in chair  Transfers Overall transfer level: Needs assistance Equipment used: Rolling walker (2 wheeled) Transfers: Sit to/from Stand Sit to Stand: Supervision         General transfer comment: cues to step backward with strong LE first    Balance Overall balance assessment: Needs assistance Sitting-balance support: No upper extremity supported Sitting balance-Leahy Scale: Good     Standing balance support: Single extremity supported Standing balance-Leahy Scale: Fair                              ADL Overall ADL's : Needs assistance/impaired                                     Functional mobility during ADLs: Supervision/safety;Rolling walker;Cueing for sequencing General ADL Comments: Educated pt/famiy on funcitonal mobility regarding ADL wtih use of shower chair and 3 in 1. Family states they have DME from family member. Family assisting pt to bathroom on entry to room and report independence with toileting. Educated pt/family on safe technique for walk in  shower trasnfer. Pt able to return demonstrate. Family verbalized understanding. Discussed importance of pt completing ADL to regain her independence and use as a form of ROM exercise. Discussed home safety and reducing risk of falls.                      Pertinent Vitals/Pain Pain Assessment: No/denies pain Pain Intervention(s): Monitored during session;Ice applied; positioned in zero knee     Hand Dominance     Extremity/Trunk Assessment Upper Extremity Assessment Upper Extremity Assessment: Overall WFL for tasks assessed   Lower Extremity Assessment Lower Extremity Assessment: Defer to PT evaluation   Cervical / Trunk Assessment Cervical / Trunk Assessment: Normal   Communication Communication Communication: No difficulties   Cognition Arousal/Alertness: Awake/alert Behavior During Therapy: WFL for tasks assessed/performed Overall Cognitive Status: Within Functional Limits for tasks assessed                     General Comments       Exercises Exercises: Total Joint     Shoulder Instructions      Maurie Boettcher, OTR/L  [phone] 9/8/2016Home Living Family/patient expects to be discharged to:: Private residence Living Arrangements: Spouse/significant other Available Help at Discharge: Family Type of Home: House Home Access: Stairs to enter [employer] of Steps: 2-6 (2 no rails, 6 with rails (alternate entrances)) Entrance Stairs-Rails:  (one entrance  with and one entrance without) Home Layout: Able to live on main level with bedroom/bathroom     Bathroom Shower/Tub: Occupational psychologist: Standard Bathroom Accessibility: Yes How Accessible: Accessible via walker Home Equipment: [place] - 2 wheels;Bedside commode;Shower seat;Hand held shower head          Prior Functioning/Environment Level of Independence: Independent             OT Diagnosis: Generalized weakness;Acute pain   OT Problem List: Decreased  strength;Decreased activity tolerance;Decreased knowledge of use of DME or AE   OT Treatment/Interventions:      OT Goals(Current goals can be found in the care plan section) Acute Rehab OT Goals Patient Stated Goal: go home upon D/C OT Goal Formulation: All assessment and education complete, DC therapy  OT Frequency:     Barriers to D/C:            Co-evaluation              End of Session Equipment Utilized During Treatment: Rolling walker CPM Right Knee CPM Right Knee: Off Nurse Communication: Mobility status  Activity Tolerance: Patient tolerated treatment well Patient left: in chair;with call bell/phone within reach;with family/visitor present   Time: 1431-1446 OT Time Calculation (min): 15 min Charges:  OT General Charges $OT Visit: 1 Procedure OT Evaluation $Initial OT Evaluation Tier I: 1 Procedure G-Codes:    Osman Calzadilla,HILLARY 10-18-2014, 2:59 PM

## 2014-09-25 NOTE — Care Management Note (Signed)
Case Management Note  Patient Details  Name: [name] MRN: [id number] Date of Birth: 06/29/1933  Subjective/Objective:          S/p right total knee arthroplasty          Action/Plan: Set up with [facility] for HHPT by MD office. Spoke with patient, no change in discharge plan. Patient stated that her husband will be available to assist her after discharge. [employer] will be providing CPM at home, patient already has a rolling walker and 3N1 at home.    Expected Discharge Date:                  Expected Discharge Plan:  [place]  In-House Referral:  NA  Discharge planning Services  CM Consult  Post Acute Care Choice:  Durable Medical Equipment, Home Health Choice offered to:  Patient  DME Arranged:  CPM DME Agency:  [employer]  HH Arranged:  PT [place] Agency:  [facility]  Status of Service:  Completed, signed off  Medicare Important Message Given:    Date Medicare IM Given:    Medicare IM give by:    Date Additional Medicare IM Given:    Additional Medicare Important Message give by:     If discussed at [place] of Stay Meetings, dates discussed:    Additional Comments:  Nila Nephew, RN 09/25/2014, 4:24 PM

## 2014-09-26 LAB — BASIC METABOLIC PANEL
ANION GAP: 5 (ref 5–15)
BUN: 13 mg/dL (ref 6–20)
CALCIUM: 9 mg/dL (ref 8.9–10.3)
CO2: 25 mmol/L (ref 22–32)
CREATININE: 0.94 mg/dL (ref 0.44–1.00)
Chloride: 107 mmol/L (ref 101–111)
GFR, EST NON AFRICAN AMERICAN: 56 mL/min — AB (ref >60)
GLUCOSE: 140 mg/dL — AB (ref 65–99)
Potassium: 4 mmol/L (ref 3.5–5.1)
Sodium: 137 mmol/L (ref 135–145)

## 2014-09-26 LAB — CBC
HCT: 28.6 % — ABNORMAL LOW (ref 36.0–46.0)
Hemoglobin: 9.7 g/dL — ABNORMAL LOW (ref 12.0–15.0)
MCH: 32.7 pg (ref 26.0–34.0)
MCHC: 33.9 g/dL (ref 30.0–36.0)
MCV: 96.3 fL (ref 78.0–100.0)
PLATELETS: 127 10*3/uL — AB (ref 150–400)
RBC: 2.97 MIL/uL — ABNORMAL LOW (ref 3.87–5.11)
RDW: 13.3 % (ref 11.5–15.5)
WBC: 12 10*3/uL — AB (ref 4.0–10.5)

## 2014-09-26 NOTE — Progress Notes (Signed)
Physical Therapy Treatment Patient Details Name: [name] MRN: [id number] DOB: 1933/04/20 Today's Date: 09/26/2014    History of Present Illness TOTAL KNEE ARTHROPLASTY (Right)    PT Comments    Patient is making good progress with PT.  From a mobility standpoint anticipate patient will be ready for DC home with family support.  Patient denies any questions or concerns.     Follow Up Recommendations  Home health PT;Supervision for mobility/OOB     Equipment Recommendations  None recommended by PT    Recommendations for Other Services       Precautions / Restrictions Precautions Precautions: Knee Precaution Booklet Issued: Yes (comment) Precaution Comments: HEP Restrictions Weight Bearing Restrictions: Yes RLE Weight Bearing: Weight bearing as tolerated    Mobility  Bed Mobility Overal bed mobility: Needs Assistance Bed Mobility: Supine to Sit     Supine to sit: Modified independent (Device/Increase time)        Transfers Overall transfer level: Needs assistance Equipment used: Rolling walker (2 wheeled) Transfers: Sit to/from Stand Sit to Stand: Supervision            Ambulation/Gait Ambulation/Gait assistance: Supervision Ambulation Distance (Feet): 150 Feet Assistive device: Rolling walker (2 wheeled) Gait Pattern/deviations: Step-through pattern Gait velocity: decreased (mild)   General Gait Details: cues for heel toe pattern   Wheelchair Mobility    Modified Rankin (Stroke Patients Only)       Balance Overall balance assessment: Needs assistance Sitting-balance support: No upper extremity supported Sitting balance-Leahy Scale: Good     Standing balance support: During functional activity Standing balance-Leahy Scale: Fair Standing balance comment: using rw                    Cognition Arousal/Alertness: Awake/alert Behavior During Therapy: WFL for tasks assessed/performed Overall Cognitive Status: Within  Functional Limits for tasks assessed                      Exercises Total Joint Exercises Ankle Circles/Pumps: AROM;Both;15 reps Quad Sets: Strengthening;Right;10 reps Towel Squeeze: Both;Strengthening;10 reps Short Arc Quad: Right;Strengthening;10 reps Heel Slides: AAROM;Right;10 reps Hip ABduction/ADduction: Right;Strengthening;10 reps Straight Leg Raises: Strengthening;Right;10 reps Long Arc Quad: Strengthening;Right;10 reps Knee Flexion: Right;10 reps;Seated;AROM Goniometric ROM: 92 degrees flexion    General Comments        Pertinent Vitals/Pain Pain Assessment: No/denies pain Pain Intervention(s): Monitored during session    Home Living                      Prior Function            PT Goals (current goals can now be found in the care plan section) Acute Rehab PT Goals Patient Stated Goal: go home upon D/C PT Goal Formulation: With patient Time For Goal Achievement: 10/09/14 Potential to Achieve Goals: Good Progress towards PT goals: Progressing toward goals    Frequency  7X/week    PT Plan Current plan remains appropriate    Co-evaluation             End of Session Equipment Utilized During Treatment: Gait belt Activity Tolerance: Patient tolerated treatment well Patient left: in chair;with call bell/phone within reach     Time: 1253-1319 PT Time Calculation (min) (ACUTE ONLY): 26 min  Charges:  $Gait Training: 8-22 mins $Therapeutic Exercise: 8-22 mins                    G Codes:  Cassell Clement, PT, CSCS Pager [phone] Office [phone]   09/26/2014, 3:10 PM

## 2014-09-26 NOTE — Progress Notes (Signed)
Physical Therapy Treatment Patient Details Name: [name] MRN: [id number] DOB: 06-May-1933 Today's Date: 09/26/2014    History of Present Illness TOTAL KNEE ARTHROPLASTY (Right)    PT Comments    Patient is making good progress with PT.  From a mobility standpoint anticipate patient will be ready for DC home. Patient currently denies any questions or concerns.    Follow Up Recommendations  Home health PT;Supervision for mobility/OOB     Equipment Recommendations  None recommended by PT    Recommendations for Other Services       Precautions / Restrictions Precautions Precautions: Knee Precaution Booklet Issued: Yes (comment) Precaution Comments: HEP Restrictions Weight Bearing Restrictions: Yes RLE Weight Bearing: Weight bearing as tolerated    Mobility  Bed Mobility Overal bed mobility: Needs Assistance Bed Mobility: Supine to Sit     Supine to sit: Modified independent (Device/Increase time)        Transfers Overall transfer level: Needs assistance Equipment used: Rolling walker (2 wheeled) Transfers: Sit to/from Stand Sit to Stand: Supervision            Ambulation/Gait Ambulation/Gait assistance: Supervision Ambulation Distance (Feet): 150 Feet Assistive device: Rolling walker (2 wheeled) Gait Pattern/deviations: Step-through pattern Gait velocity: decreased (mild)   General Gait Details: cues for heel toe pattern   Stairs Stairs: Yes Stairs assistance: Supervision Stair Management: One rail Left;Step to pattern Number of Stairs: 5 General stair comments: safe and stable pattern, no loss of balance  Wheelchair Mobility    Modified Rankin (Stroke Patients Only)       Balance Overall balance assessment: Needs assistance Sitting-balance support: No upper extremity supported Sitting balance-Leahy Scale: Good     Standing balance support: During functional activity Standing balance-Leahy Scale: Fair Standing balance  comment: using rw with ambulation                    Cognition Arousal/Alertness: Awake/alert Behavior During Therapy: WFL for tasks assessed/performed Overall Cognitive Status: Within Functional Limits for tasks assessed                      Exercises Total Joint Exercises Ankle Circles/Pumps: AROM;Both;15 reps Quad Sets: Strengthening;Right;10 reps Towel Squeeze: Both;Strengthening;10 reps Short Arc Quad: Right;Strengthening;10 reps Heel Slides: AAROM;Right;10 reps Hip ABduction/ADduction: Right;Strengthening;10 reps Straight Leg Raises: Strengthening;Right;10 reps Long Arc Quad: Strengthening;Right;10 reps Goniometric ROM: 92 degrees flexion    General Comments        Pertinent Vitals/Pain Pain Assessment: No/denies pain Pain Intervention(s): Monitored during session    Home Living                      Prior Function            PT Goals (current goals can now be found in the care plan section) Acute Rehab PT Goals Patient Stated Goal: go home upon D/C PT Goal Formulation: With patient Time For Goal Achievement: 10/09/14 Potential to Achieve Goals: Good Progress towards PT goals: Progressing toward goals    Frequency  7X/week    PT Plan Current plan remains appropriate    Co-evaluation             End of Session Equipment Utilized During Treatment: Gait belt Activity Tolerance: Patient tolerated treatment well Patient left: in chair;with call bell/phone within reach     Time: 0842-0907 PT Time Calculation (min) (ACUTE ONLY): 25 min  Charges:  $Gait Training: 8-22 mins $Therapeutic Exercise: 8-22 mins  G Codes:      Cassell Clement, PT, CSCS Pager [phone] Office [phone]  09/26/2014, 11:31 AM

## 2014-09-26 NOTE — Progress Notes (Signed)
Subjective: 2 Days Post-Op Procedure(s) (LRB): TOTAL [name] (Right) Patient reports pain as mild.  No nausea/vomiting, lightheadedness/dizziness, chest pain/sob.  Positive flatus but no bm.  Tolerating diet.  Progressing nicely!  Objective: Vital signs in last 24 hours: Temp:  [97.5 F (36.4 C)-99.8 F (37.7 C)] 97.5 F (36.4 C) (09/09 0600) Pulse Rate:  [70-80] 70 (09/09 0600) Resp:  [16] 16 (09/09 0600) BP: (136-140)/(61-63) 136/61 mmHg (09/09 0600) SpO2:  [98 %-100 %] 100 % (09/09 0600)  Intake/Output from previous day: 09/08 0701 - 09/09 0700 In: 600 [P.O.:600] Out: -  Intake/Output this shift:     Recent Labs  09/25/14 0808 09/26/14 0524  HGB 10.5* 9.7*    Recent Labs  09/25/14 0808 09/26/14 0524  WBC 8.2 12.0*  RBC 3.27* 2.97*  HCT 31.6* 28.6*  PLT 153 127*    Recent Labs  09/25/14 0808 09/26/14 0524  NA 138 137  K 3.9 4.0  CL 108 107  CO2 24 25  BUN 12 13  CREATININE 1.02* 0.94  GLUCOSE 150* 140*  CALCIUM 8.6* 9.0   No results for input(s): LABPT, INR in the last 72 hours.  Neurologically intact Neurovascular intact Sensation intact distally Intact pulses distally Dorsiflexion/Plantar flexion intact Incision: dressing C/D/I No cellulitis present Dressing changed by me today Negative homans bilaterally  Assessment/Plan: 2 Days Post-Op Procedure(s) (LRB): TOTAL [name] (Right) Advance diet Up with therapy D/C IV fluids Discharge home with home health today following either first or second session of PT. WBAT RLE ABLA-mild and stable Dry dressing change prn  [name] 09/26/2014, 7:31 AM

## 2014-10-13 ENCOUNTER — Ambulatory Visit: Payer: Medicare Other | Attending: Orthopedic Surgery | Admitting: Physical Therapy

## 2014-10-13 DIAGNOSIS — R531 Weakness: Secondary | ICD-10-CM | POA: Insufficient documentation

## 2014-10-13 DIAGNOSIS — M25661 Stiffness of right knee, not elsewhere classified: Secondary | ICD-10-CM | POA: Insufficient documentation

## 2014-10-13 DIAGNOSIS — R609 Edema, unspecified: Secondary | ICD-10-CM | POA: Diagnosis present

## 2014-10-13 DIAGNOSIS — M25561 Pain in right knee: Secondary | ICD-10-CM | POA: Diagnosis present

## 2014-10-13 DIAGNOSIS — R269 Unspecified abnormalities of gait and mobility: Secondary | ICD-10-CM | POA: Insufficient documentation

## 2014-10-13 NOTE — Therapy (Signed)
[facility] [place], [place], [address] Phone: [phone]   Fax:  [phone]  Physical Therapy Evaluation  Patient Details  Name: [name] MRN: [id number] Date of Birth: December 17, 1933 Referring Provider:  Ninetta Lights, MD  Encounter Date: 10/13/2014      PT End of Session - 10/13/14 1034    Visit Number 1   Number of Visits 18   Date for PT Re-Evaluation 11/24/14   PT Start Time 6301   PT Stop Time 1112   PT Time Calculation (min) 37 min   Activity Tolerance Patient tolerated treatment well   Behavior During Therapy [facility] for tasks assessed/performed      Past Medical History  Diagnosis Date  . Hypertension   . Arthritis   . Nocturia   . Hyperlipemia   . Hypothyroidism   . Anemia     hx -2013 blood transfusion  polyps    Past Surgical History  Procedure Laterality Date  . Fracture surgery      left leg  rod  . Knee arthroscopy Right   . Lumbar laminectomy/decompression microdiscectomy Right 04/10/2012    Procedure: LUMBAR LAMINECTOMY/DECOMPRESSION MICRODISCECTOMY 1 LEVEL;  Surgeon: Erline Levine, MD;  Location: [place] [facility];  Service: Neurosurgery;  Laterality: Right;  Right Lumbar three - four Microdiskectomy  . Joint replacement Left     hip  . Total knee arthroplasty Right 09/24/2014    Procedure: TOTAL KNEE ARTHROPLASTY;  Surgeon: Ninetta Lights, MD;  Location: [place];  Service: Orthopedics;  Laterality: Right;    There were no vitals filed for this visit.  Visit Diagnosis:  Knee stiffness, right - Plan: PT plan of care cert/re-cert  Abnormality of gait - Plan: PT plan of care cert/re-cert  Edema - Plan: PT plan of care cert/re-cert  Pain in right knee - Plan: PT plan of care cert/re-cert  Generalized weakness - Plan: PT plan of care cert/re-cert      Subjective Assessment - 10/13/14 1038    Subjective Patient is s/p R TKR on 09/24/14. She denies pain except with bending the knee  back (up to 7/10) but calls it more of a stretch. She amb without AD. She brings the cane out of the house but does not always use it.   Pertinent History L THA   Patient Stated Goals Get the knee working better   Currently in Pain? No/denies            [facility] PT Assessment - 10/13/14 0001    Assessment   Medical Diagnosis R TKR   Onset Date/Surgical Date 09/24/14   Precautions   Precautions Knee   Precaution Comments TKR protocol   Balance Screen   Has the patient fallen in the past 6 months No   Has the patient had a decrease in activity level because of a fear of falling?  No   Is the patient reluctant to leave their home because of a fear of falling?  No   Home Environment   Living Environment Private residence   Living Arrangements Spouse/significant other   Home Access Stairs to enter   Entrance Stairs-Number of Steps [place] One level   Additional Comments uses step to gait on stairs   Prior Function   Level of Independence Independent with basic ADLs   Vocation Retired   Leisure playing bingo   Cognition   Overall Cognitive Status Within Functional Limits for  tasks assessed   Observation/Other Assessments   Focus on Therapeutic Outcomes (FOTO)  52% limited   Observation/Other Assessments-Edema    Edema Circumferential  L 42.5 cm; R 46.5 cm   ROM / Strength   AROM / PROM / Strength AROM;PROM;Strength   AROM   AROM Assessment Site Knee   Right/Left Knee Right;Left   Right Knee Extension -4   Right Knee Flexion 104   Left Knee Flexion 121   PROM   PROM Assessment Site Knee   Right/Left Knee Right   Right Knee Extension -2   Right Knee Flexion 115   Strength   Overall Strength Comments L hip ext 4-/5, knee flex 4/5   Strength Assessment Site Hip;Knee   Right/Left Hip Right   Right Hip Flexion 4+/5   Right Hip Extension 4-/5   Right Hip ABduction 4/5   Right/Left Knee Right   Right Knee Flexion 4-/5   Right Knee  Extension 4+/5   Palpation   Patella mobility WNL   Palpation comment unremarkable   Ambulation/Gait   Gait Comments amb with decreased heel strike which she can correct with VCs; decreased step length B                   [facility] Adult PT Treatment/Exercise - 10/13/14 0001    Exercises   Exercises Knee/Hip   Knee/Hip Exercises: Aerobic   Recumbent Bike 5 min   Knee/Hip Exercises: Supine   Bridges 5 reps                PT Education - 10/13/14 1129    Education provided Yes   Education Details HEP: Reviewed current HEP, patient advised to continue   Person(s) Educated Patient   Methods Explanation;Demonstration;Verbal cues   Comprehension Verbalized understanding;Returned demonstration          PT Short Term Goals - 10/13/14 1123    PT SHORT TERM GOAL #1   Title I with initial HEP   Time 2   Period Weeks   Status New           PT Long Term Goals - 10/13/14 1123    PT LONG TERM GOAL #1   Title I with advanced HEP   Time 6   Period Weeks   Status New   PT LONG TERM GOAL #2   Title R knee ROM of 0-120 degrees to normalize ADLS   Time 6   Period Weeks   Status New   PT LONG TERM GOAL #3   Title able to climb stairs with reciprocal gait   Time 6   Period Weeks   Status New   PT LONG TERM GOAL #4   Title demonstrate normal gait pattern without AD   Time 6   Period Weeks   Status New   PT LONG TERM GOAL #5   Title improved RLE strength to 4+/5    Time 6   Period Weeks   Status New               Plan - 10/13/14 1116    Clinical Impression Statement Patient is a 81 year old female s/p R TKR on 09/24/14. She is doing great with no c/o pain except with stretching. She has limited ROM but is progressing well. She amb without AD and has slight gait deviations including with stair climbing. She also has Bil hip and knee weakness.    Pt will benefit from skilled therapeutic intervention in order to  improve on the following deficits Abnormal  gait;Pain;Impaired flexibility;Decreased strength;Increased edema   Rehab Potential Excellent   Clinical Impairments Affecting Rehab Potential Lt THA   PT Frequency 3x / week   PT Duration 6 weeks   PT Treatment/Interventions ADLs/Self Care Home Management;Electrical Stimulation;Therapeutic exercise;Stair training;Gait training;Neuromuscular re-education;Balance training;Patient/family education;Manual techniques;Vasopneumatic Device;Taping;Passive range of motion;Scar mobilization   PT Next Visit Plan TKE, Hip/knee strengthening, balance, ROM, modalities prn for edema/pain (review standing HS curls for proper form)   Consulted and Agree with Plan of Care Patient          G-Codes - 06-Nov-2014 1128    Functional Assessment Tool Used FOTO 52% LIMITED   Functional Limitation Mobility: Walking and moving around   Mobility: Walking and Moving Around Current Status [phone]) At least 40 percent but less than 60 percent impaired, limited or restricted   Mobility: Walking and Moving Around Goal Status [phone]) At least 40 percent but less than 60 percent impaired, limited or restricted       Problem List Patient Active Problem List   Diagnosis Date Noted  . DJD (degenerative joint disease) of knee 09/24/2014  . COLONIC POLYPS, ADENOMATOUS, HX OF 10/07/2008  . HYPOTHYROIDISM 10/06/2008  . HYPERCHOLESTEROLEMIA 10/06/2008  . DEPRESSION 10/06/2008  . HYPERTENSION 10/06/2008  . HEMORRHOIDS 10/06/2008  . DIVERTICULAR DISEASE 10/06/2008  . OSTEOPOROSIS 10/06/2008  . DYSPNEA 10/06/2008  . CHEST DISCOMFORT 10/06/2008  . COLONOSCOPY, HX OF 10/06/2008  . ARTHROSCOPY, RIGHT KNEE, HX OF 10/06/2008    Madelyn Flavors PT  2014-11-06, 11:33 AM  [facility] [address] [place], [place], [address] Phone: [phone]   Fax:  [phone]

## 2014-10-15 ENCOUNTER — Ambulatory Visit: Payer: Medicare Other | Admitting: Physical Therapy

## 2014-10-15 DIAGNOSIS — M25561 Pain in right knee: Secondary | ICD-10-CM

## 2014-10-15 DIAGNOSIS — R531 Weakness: Secondary | ICD-10-CM

## 2014-10-15 DIAGNOSIS — R269 Unspecified abnormalities of gait and mobility: Secondary | ICD-10-CM

## 2014-10-15 DIAGNOSIS — M25661 Stiffness of right knee, not elsewhere classified: Secondary | ICD-10-CM | POA: Diagnosis not present

## 2014-10-15 DIAGNOSIS — R609 Edema, unspecified: Secondary | ICD-10-CM

## 2014-10-15 NOTE — Therapy (Signed)
[facility] [place], [place], [address] Phone: [phone]   Fax:  [phone]  Physical Therapy Treatment  Patient Details  Name: [name] MRN: [id number] Date of Birth: 1933-03-14 Referring Provider:  Dione Housekeeper, MD  Encounter Date: 10/15/2014      PT End of Session - 10/15/14 1537    Visit Number 2   Number of Visits 18   Date for PT Re-Evaluation 11/24/14   PT Start Time 0230   PT Stop Time 0318   PT Time Calculation (min) 48 min   Activity Tolerance Patient tolerated treatment well   Behavior During Therapy [facility] for tasks assessed/performed      Past Medical History  Diagnosis Date  . Hypertension   . Arthritis   . Nocturia   . Hyperlipemia   . Hypothyroidism   . Anemia     hx -2013 blood transfusion  polyps    Past Surgical History  Procedure Laterality Date  . Fracture surgery      left leg  rod  . Knee arthroscopy Right   . Lumbar laminectomy/decompression microdiscectomy Right 04/10/2012    Procedure: LUMBAR LAMINECTOMY/DECOMPRESSION MICRODISCECTOMY 1 LEVEL;  Surgeon: Erline Levine, MD;  Location: [place] [facility];  Service: Neurosurgery;  Laterality: Right;  Right Lumbar three - four Microdiskectomy  . Joint replacement Left     hip  . Total knee arthroplasty Right 09/24/2014    Procedure: TOTAL KNEE ARTHROPLASTY;  Surgeon: Ninetta Lights, MD;  Location: [place];  Service: Orthopedics;  Laterality: Right;    There were no vitals filed for this visit.  Visit Diagnosis:  Knee stiffness, right  Abnormality of gait  Edema  Pain in right knee  Generalized weakness      Subjective Assessment - 10/15/14 1533    Subjective I'm doing well today.   Pain Score 5    Pain Location Knee   Pain Orientation Right   Pain Descriptors / Indicators Aching   Pain Type Surgical pain   Pain Frequency Intermittent                         [facility] Adult PT Treatment/Exercise -  10/15/14 0001    Exercises   Exercises Knee/Hip   Knee/Hip Exercises: Aerobic   Nustep Level 3 x 17 minutes.   Modalities   Modalities Network engineer Stimulation Location Right knee.   Electrical Stimulation Action 1-10 HZ IFC x 20 minutes.   Electrical Stimulation Goals Edema;Pain   Vasopneumatic   Number Minutes Vasopneumatic  20 minutes   Vasopnuematic Location  --  Right knee.   Vasopneumatic Pressure Medium   Vasopneumatic Temperature  54 degrees   Manual Therapy   Manual therapy comments 1-1 right patellar mobs and PROM to right kne x 6 minutes.                  PT Short Term Goals - 10/13/14 1123    PT SHORT TERM GOAL #1   Title I with initial HEP   Time 2   Period Weeks   Status New           PT Long Term Goals - 10/13/14 1123    PT LONG TERM GOAL #1   Title I with advanced HEP   Time 6   Period Weeks   Status New   PT LONG TERM GOAL #2   Title R knee  ROM of 0-120 degrees to normalize ADLS   Time 6   Period Weeks   Status New   PT LONG TERM GOAL #3   Title able to climb stairs with reciprocal gait   Time 6   Period Weeks   Status New   PT LONG TERM GOAL #4   Title demonstrate normal gait pattern without AD   Time 6   Period Weeks   Status New   PT LONG TERM GOAL #5   Title improved RLE strength to 4+/5    Time 6   Period Weeks   Status New               Problem List Patient Active Problem List   Diagnosis Date Noted  . DJD (degenerative joint disease) of knee 09/24/2014  . COLONIC POLYPS, ADENOMATOUS, HX OF 10/07/2008  . HYPOTHYROIDISM 10/06/2008  . HYPERCHOLESTEROLEMIA 10/06/2008  . DEPRESSION 10/06/2008  . HYPERTENSION 10/06/2008  . HEMORRHOIDS 10/06/2008  . DIVERTICULAR DISEASE 10/06/2008  . OSTEOPOROSIS 10/06/2008  . DYSPNEA 10/06/2008  . CHEST DISCOMFORT 10/06/2008  . COLONOSCOPY, HX OF 10/06/2008  . ARTHROSCOPY, RIGHT KNEE, HX OF 10/06/2008     APPLEGATE, [place] MPT 10/15/2014, 3:41 PM  [facility] [address] [place], [place], [address] Phone: [phone]   Fax:  [phone]

## 2014-10-20 ENCOUNTER — Encounter: Payer: Self-pay | Admitting: Physical Therapy

## 2014-10-20 ENCOUNTER — Ambulatory Visit: Payer: Medicare Other | Attending: Orthopedic Surgery | Admitting: Physical Therapy

## 2014-10-20 DIAGNOSIS — R600 Localized edema: Secondary | ICD-10-CM

## 2014-10-20 DIAGNOSIS — M25661 Stiffness of right knee, not elsewhere classified: Secondary | ICD-10-CM | POA: Diagnosis present

## 2014-10-20 DIAGNOSIS — R269 Unspecified abnormalities of gait and mobility: Secondary | ICD-10-CM | POA: Diagnosis present

## 2014-10-20 DIAGNOSIS — M25561 Pain in right knee: Secondary | ICD-10-CM | POA: Insufficient documentation

## 2014-10-20 DIAGNOSIS — R531 Weakness: Secondary | ICD-10-CM | POA: Insufficient documentation

## 2014-10-20 NOTE — Therapy (Signed)
[facility] [place], [place], [address] Phone: [phone]   Fax:  [phone]  Physical Therapy Treatment  Patient Details  Name: [name] MRN: [id number] Date of Birth: July 21, 1933 Referring Provider:  Dione Housekeeper, MD  Encounter Date: 10/20/2014      PT End of Session - 10/20/14 1121    Visit Number 3   Number of Visits 18   Date for PT Re-Evaluation 11/24/14   PT Start Time 1028   PT Stop Time 1130   PT Time Calculation (min) 62 min   Activity Tolerance Patient tolerated treatment well   Behavior During Therapy [facility] for tasks assessed/performed      Past Medical History  Diagnosis Date  . Hypertension   . Arthritis   . Nocturia   . Hyperlipemia   . Hypothyroidism   . Anemia     hx -2013 blood transfusion  polyps    Past Surgical History  Procedure Laterality Date  . Fracture surgery      left leg  rod  . Knee arthroscopy Right   . Lumbar laminectomy/decompression microdiscectomy Right 04/10/2012    Procedure: LUMBAR LAMINECTOMY/DECOMPRESSION MICRODISCECTOMY 1 LEVEL;  Surgeon: Erline Levine, MD;  Location: [place] [facility];  Service: Neurosurgery;  Laterality: Right;  Right Lumbar three - four Microdiskectomy  . Joint replacement Left     hip  . Total knee arthroplasty Right 09/24/2014    Procedure: TOTAL KNEE ARTHROPLASTY;  Surgeon: Ninetta Lights, MD;  Location: [place];  Service: Orthopedics;  Laterality: Right;    There were no vitals filed for this visit.  Visit Diagnosis:  Knee stiffness, right  Abnormality of gait  Pain in right knee  Generalized weakness  Localized edema      Subjective Assessment - 10/20/14 1036    Subjective stiff knee yet no pain upon arrival   Patient Stated Goals Get the knee working better   Currently in Pain? No/denies            [facility] PT Assessment - 10/20/14 0001    ROM / Strength   AROM / PROM / Strength AROM;PROM   AROM   Overall AROM   Deficits   AROM Assessment Site Knee   Right/Left Knee Right   Right Knee Extension -4   Right Knee Flexion 109   PROM   Overall PROM  Deficits   PROM Assessment Site Knee   Right/Left Knee Right   Right Knee Extension -2   Right Knee Flexion 115                     [facility] Adult PT Treatment/Exercise - 10/20/14 0001    Knee/Hip Exercises: Stretches   Knee: Self-Stretch to increase Flexion Right;3 reps;30 seconds   Knee/Hip Exercises: Aerobic   Nustep 62mn L4   Knee/Hip Exercises: Standing   Lateral Step Up Right;3 sets;10 reps;Step Height: 6"   Forward Step Up Right;3 sets;10 reps;Step Height: 6"   Rocker Board 3 minutes   Electrical Stimulation   Electrical Stimulation Location Right knee.   EChartered certified accountantIFC   Electrical Stimulation Parameters 1-_0    Electrical Stimulation Goals Edema;Pain   Vasopneumatic   Number Minutes Vasopneumatic  15 minutes   Vasopnuematic Location  Knee   Vasopneumatic Pressure Medium   Manual Therapy   Manual Therapy Passive ROM   Passive ROM gentle low load holds for knee flexion and ext  PT Education - 10/20/14 1121    Education provided Yes   Education Details HEP   Person(s) Educated Patient   Methods Explanation;Demonstration;Handout   Comprehension Verbalized understanding;Returned demonstration          PT Short Term Goals - 10/20/14 1131    PT SHORT TERM GOAL #1   Title I with initial HEP   Time 2   Period Weeks   Status Achieved           PT Long Term Goals - 10/20/14 1131    PT LONG TERM GOAL #1   Title I with advanced HEP   Time 6   Period Weeks   Status On-going   PT LONG TERM GOAL #2   Title R knee ROM of 0-120 degrees to normalize ADLS   Time 6   Period Weeks   Status On-going   PT LONG TERM GOAL #3   Title able to climb stairs with reciprocal gait   Time 6   Period Weeks   Status On-going   PT LONG TERM GOAL #4   Title demonstrate normal gait  pattern without AD   Time 6   Period Weeks   Status On-going   PT LONG TERM GOAL #5   Title improved RLE strength to 4+/5    Time 6   Period Weeks   Status On-going               Plan - 10/20/14 1132    Clinical Impression Statement Patient progressing with all activities today. Had no pain upon arrival yet increased pain with manual flexion PROM up to 3/10. Patient was educated on self ROM and given HE today also elevation and ice for swelling. Patieng Met STG #1 and LTG's ongoing due to pain, strength and ROM deficits.    Pt will benefit from skilled therapeutic intervention in order to improve on the following deficits Abnormal gait;Pain;Impaired flexibility;Decreased strength;Increased edema   Rehab Potential Excellent   Clinical Impairments Affecting Rehab Potential Lt THA   PT Frequency 3x / week   PT Duration 6 weeks   PT Treatment/Interventions ADLs/Self Care Home Management;Electrical Stimulation;Therapeutic exercise;Stair training;Gait training;Neuromuscular re-education;Balance training;Patient/family education;Manual techniques;Vasopneumatic Device;Taping;Passive range of motion;Scar mobilization   PT Next Visit Plan TKE, Hip/knee strengthening, balance, ROM, modalities prn for edema/pain (review standing HS curls for proper form) (MD. Percell Miller 11/04/14)   Consulted and Agree with Plan of Care Patient        Problem List Patient Active Problem List   Diagnosis Date Noted  . DJD (degenerative joint disease) of knee 09/24/2014  . COLONIC POLYPS, ADENOMATOUS, HX OF 10/07/2008  . HYPOTHYROIDISM 10/06/2008  . HYPERCHOLESTEROLEMIA 10/06/2008  . DEPRESSION 10/06/2008  . HYPERTENSION 10/06/2008  . HEMORRHOIDS 10/06/2008  . DIVERTICULAR DISEASE 10/06/2008  . OSTEOPOROSIS 10/06/2008  . DYSPNEA 10/06/2008  . CHEST DISCOMFORT 10/06/2008  . COLONOSCOPY, HX OF 10/06/2008  . ARTHROSCOPY, RIGHT KNEE, HX OF 10/06/2008    Joylynn Defrancesco P, PTA 10/20/2014, 11:36  AM  [facility] [place], [place], [address] Phone: [phone]   Fax:  [phone]

## 2014-10-20 NOTE — Patient Instructions (Signed)
Knee Extension Mobilization: Towel Prop   With rolled towel under right ankle, place _1-5___ pound weight across knee. Hold __5+__ minutes. Repeat __2-3__ times per set. Do __2__ sets per session. Do __2-4__ sessions per day.    Knee Flexion Stretch on Step  Place foot on step and lean forward until you feel a good stretch in front of knee.   hold 30 sec x 5-10 perform 2-4 x daily    

## 2014-10-22 ENCOUNTER — Ambulatory Visit: Payer: Medicare Other | Admitting: Physical Therapy

## 2014-10-22 ENCOUNTER — Encounter: Payer: Self-pay | Admitting: Physical Therapy

## 2014-10-22 DIAGNOSIS — M25561 Pain in right knee: Secondary | ICD-10-CM

## 2014-10-22 DIAGNOSIS — R600 Localized edema: Secondary | ICD-10-CM

## 2014-10-22 DIAGNOSIS — R269 Unspecified abnormalities of gait and mobility: Secondary | ICD-10-CM

## 2014-10-22 DIAGNOSIS — M25661 Stiffness of right knee, not elsewhere classified: Secondary | ICD-10-CM | POA: Diagnosis not present

## 2014-10-22 DIAGNOSIS — R531 Weakness: Secondary | ICD-10-CM

## 2014-10-22 NOTE — Therapy (Signed)
[facility] [place], [place], [address] Phone: [phone]   Fax:  [phone]  Physical Therapy Treatment  Patient Details  Name: [name] MRN: [id number] Date of Birth: 23-Nov-1933 Referring Provider:  Dione Housekeeper, MD  Encounter Date: 10/22/2014      PT End of Session - 10/22/14 1110    Visit Number 4   Number of Visits 18   Date for PT Re-Evaluation 11/24/14   PT Start Time 7494   PT Stop Time 1127   PT Time Calculation (min) 58 min   Activity Tolerance Patient tolerated treatment well   Behavior During Therapy [facility] for tasks assessed/performed      Past Medical History  Diagnosis Date  . Hypertension   . Arthritis   . Nocturia   . Hyperlipemia   . Hypothyroidism   . Anemia     hx -2013 blood transfusion  polyps    Past Surgical History  Procedure Laterality Date  . Fracture surgery      left leg  rod  . Knee arthroscopy Right   . Lumbar laminectomy/decompression microdiscectomy Right 04/10/2012    Procedure: LUMBAR LAMINECTOMY/DECOMPRESSION MICRODISCECTOMY 1 LEVEL;  Surgeon: Erline Levine, MD;  Location: [place] [facility];  Service: Neurosurgery;  Laterality: Right;  Right Lumbar three - four Microdiskectomy  . Joint replacement Left     hip  . Total knee arthroplasty Right 09/24/2014    Procedure: TOTAL KNEE ARTHROPLASTY;  Surgeon: Ninetta Lights, MD;  Location: [place];  Service: Orthopedics;  Laterality: Right;    There were no vitals filed for this visit.  Visit Diagnosis:  Knee stiffness, right  Abnormality of gait  Pain in right knee  Generalized weakness  Localized edema      Subjective Assessment - 10/22/14 1033    Subjective patient reported stiff knee and has been working on exercises at home   Pertinent History L THA   Patient Stated Goals Get the knee working better   Currently in Pain? Yes   Pain Score 2    Pain Location Knee   Pain Orientation Right   Pain  Descriptors / Indicators Aching   Pain Type Surgical pain   Pain Frequency Intermittent   Aggravating Factors  ROM   Pain Relieving Factors rest            [facility] PT Assessment - 10/22/14 0001    AROM   Overall AROM  Deficits   AROM Assessment Site Knee   Right/Left Knee Right   Right Knee Extension -3   Right Knee Flexion 115   PROM   Overall PROM  Deficits   PROM Assessment Site Knee   Right/Left Knee Right   Right Knee Extension 0   Right Knee Flexion 120                     [facility] Adult PT Treatment/Exercise - 10/22/14 0001    Knee/Hip Exercises: Aerobic   Nustep 33min L4   Knee/Hip Exercises: Standing   Lateral Step Up Right;3 sets;10 reps;Step Height: 6"   Forward Step Up Right;3 sets;10 reps;Step Height: 6"   Rocker Board 3 minutes   Other Standing Knee Exercises TKE with Pink XTS 3x10   Knee/Hip Exercises: Sidelying   Hip ABduction Strengthening;Right;2 sets;10 reps   Electrical Stimulation   Electrical Stimulation Location Right knee.   Electrical Stimulation Action IFC   Electrical Stimulation Parameters 1-10hz    Electrical Stimulation Goals Edema;Pain  Vasopneumatic   Number Minutes Vasopneumatic  15 minutes   Vasopnuematic Location  Knee   Vasopneumatic Pressure Medium   Manual Therapy   Manual Therapy Passive ROM   Passive ROM gentle low load holds for knee flexion and ext                  PT Short Term Goals - 10/20/14 1131    PT SHORT TERM GOAL #1   Title I with initial HEP   Time 2   Period Weeks   Status Achieved           PT Long Term Goals - 10/20/14 1131    PT LONG TERM GOAL #1   Title I with advanced HEP   Time 6   Period Weeks   Status On-going   PT LONG TERM GOAL #2   Title R knee ROM of 0-120 degrees to normalize ADLS   Time 6   Period Weeks   Status On-going   PT LONG TERM GOAL #3   Title able to climb stairs with reciprocal gait   Time 6   Period Weeks   Status On-going   PT LONG TERM GOAL  #4   Title demonstrate normal gait pattern without AD   Time 6   Period Weeks   Status On-going   PT LONG TERM GOAL #5   Title improved RLE strength to 4+/5    Time 6   Period Weeks   Status On-going               Plan - 10/22/14 1114    Clinical Impression Statement Patient continues to progress with all activities. Patient has improved active and passive ROM today and is progressing with strength exercises. patient has little pain overall. Goals ongoing due to full ROM and strength deficits.   Pt will benefit from skilled therapeutic intervention in order to improve on the following deficits Abnormal gait;Pain;Impaired flexibility;Decreased strength;Increased edema   Rehab Potential Excellent   Clinical Impairments Affecting Rehab Potential Lt THA   PT Frequency 3x / week   PT Duration 6 weeks   PT Treatment/Interventions ADLs/Self Care Home Management;Electrical Stimulation;Therapeutic exercise;Stair training;Gait training;Neuromuscular re-education;Balance training;Patient/family education;Manual techniques;Vasopneumatic Device;Taping;Passive range of motion;Scar mobilization   PT Next Visit Plan TKE, Hip/knee strengthening, balance, ROM, modalities prn for edema/pain (review standing HS curls for proper form) (MD. Percell Miller 11/04/14)   Consulted and Agree with Plan of Care Patient        Problem List Patient Active Problem List   Diagnosis Date Noted  . DJD (degenerative joint disease) of knee 09/24/2014  . COLONIC POLYPS, ADENOMATOUS, HX OF 10/07/2008  . HYPOTHYROIDISM 10/06/2008  . HYPERCHOLESTEROLEMIA 10/06/2008  . DEPRESSION 10/06/2008  . HYPERTENSION 10/06/2008  . HEMORRHOIDS 10/06/2008  . DIVERTICULAR DISEASE 10/06/2008  . OSTEOPOROSIS 10/06/2008  . DYSPNEA 10/06/2008  . CHEST DISCOMFORT 10/06/2008  . COLONOSCOPY, HX OF 10/06/2008  . ARTHROSCOPY, RIGHT KNEE, HX OF 10/06/2008    Gerold Sar P, PTA 10/22/2014, 11:31 AM  [facility] [address] [place], [place], [address] Phone: [phone]   Fax:  [phone]

## 2014-10-24 ENCOUNTER — Encounter: Payer: Self-pay | Admitting: Physical Therapy

## 2014-10-24 ENCOUNTER — Ambulatory Visit: Payer: Medicare Other | Admitting: Physical Therapy

## 2014-10-24 DIAGNOSIS — M25661 Stiffness of right knee, not elsewhere classified: Secondary | ICD-10-CM

## 2014-10-24 DIAGNOSIS — R269 Unspecified abnormalities of gait and mobility: Secondary | ICD-10-CM

## 2014-10-24 DIAGNOSIS — M25561 Pain in right knee: Secondary | ICD-10-CM

## 2014-10-24 DIAGNOSIS — R600 Localized edema: Secondary | ICD-10-CM

## 2014-10-24 DIAGNOSIS — R531 Weakness: Secondary | ICD-10-CM

## 2014-10-24 NOTE — Therapy (Signed)
[facility] [place], [place], [address] Phone: [phone]   Fax:  [phone]  Physical Therapy Treatment  Patient Details  Name: [name] MRN: [id number] Date of Birth: [birth year] Referring Provider:  Dione Housekeeper, MD  Encounter Date: 10/24/2014      PT End of Session - 10/24/14 1032    Visit Number 5   Number of Visits 18   Date for PT Re-Evaluation 11/24/14   PT Start Time 1031   PT Stop Time 1117   PT Time Calculation (min) 46 min   Activity Tolerance Patient tolerated treatment well   Behavior During Therapy [facility] for tasks assessed/performed      Past Medical History  Diagnosis Date  . Hypertension   . Arthritis   . Nocturia   . Hyperlipemia   . Hypothyroidism   . Anemia     hx -2013 blood transfusion  polyps    Past Surgical History  Procedure Laterality Date  . Fracture surgery      left leg  rod  . Knee arthroscopy Right   . Lumbar laminectomy/decompression microdiscectomy Right 04/10/2012    Procedure: LUMBAR LAMINECTOMY/DECOMPRESSION MICRODISCECTOMY 1 LEVEL;  Surgeon: Erline Levine, MD;  Location: [place] [facility];  Service: Neurosurgery;  Laterality: Right;  Right Lumbar three - four Microdiskectomy  . Joint replacement Left     hip  . Total knee arthroplasty Right 09/24/2014    Procedure: TOTAL KNEE ARTHROPLASTY;  Surgeon: Ninetta Lights, MD;  Location: [place];  Service: Orthopedics;  Laterality: Right;    There were no vitals filed for this visit.  Visit Diagnosis:  Knee stiffness, right  Abnormality of gait  Pain in right knee  Generalized weakness  Localized edema      Subjective Assessment - 10/24/14 1031    Subjective Reports no pain today only achiness to which she thinks is from the wet, cold weather. Reports that the achiness woke her up last night and she took pain medication.   Pertinent History L THA   Patient Stated Goals Get the knee working better   Currently  in Pain? No/denies            [facility] PT Assessment - 10/24/14 0001    Assessment   Medical Diagnosis R TKR   Onset Date/Surgical Date 09/24/14   Next MD Visit 11/04/2014  Dr. Kathryne Hitch   Precautions   Precautions Knee   Precaution Comments TKR protocol                     [facility] Adult PT Treatment/Exercise - 10/24/14 0001    Knee/Hip Exercises: Aerobic   Nustep L5 x12 min   Knee/Hip Exercises: Standing   Terminal Knee Extension Limitations Pink XTS x30 reps   Lateral Step Up Right;3 sets;10 reps;Step Height: 6"   Forward Step Up Right;3 sets;10 reps;Step Height: 6"   Rocker Board 3 minutes   Knee/Hip Exercises: Sidelying   Hip ABduction Strengthening;Right;3 sets;10 reps   Modalities   Modalities Electrical Stimulation;Vasopneumatic   Electrical Stimulation   Electrical Stimulation Location Right knee.   Electrical Stimulation Action IFC   Electrical Stimulation Parameters 1-10 Hz x15 min   Electrical Stimulation Goals Edema;Pain   Vasopneumatic   Number Minutes Vasopneumatic  15 minutes   Vasopnuematic Location  Knee   Vasopneumatic Pressure Medium   Vasopneumatic Temperature  47  PT Short Term Goals - 10/20/14 1131    PT SHORT TERM GOAL #1   Title I with initial HEP   Time 2   Period Weeks   Status Achieved           PT Long Term Goals - 10/20/14 1131    PT LONG TERM GOAL #1   Title I with advanced HEP   Time 6   Period Weeks   Status On-going   PT LONG TERM GOAL #2   Title R knee ROM of 0-120 degrees to normalize ADLS   Time 6   Period Weeks   Status On-going   PT LONG TERM GOAL #3   Title able to climb stairs with reciprocal gait   Time 6   Period Weeks   Status On-going   PT LONG TERM GOAL #4   Title demonstrate normal gait pattern without AD   Time 6   Period Weeks   Status On-going   PT LONG TERM GOAL #5   Title improved RLE strength to 4+/5    Time 6   Period Weeks   Status On-going                Plan - 10/24/14 1103    Clinical Impression Statement Patient tolerated today's treatment very well today with no pain verbalized to PTA. Completed all exercises with good technique and form although TKE required minimal multimodal cueing to allow for R Quad contraction. Ambulates within the therapy gym without an AD although she has slight antalgic gait. Normal modalities response noted following removal of the modaliites. Denied R knee pain today following treatment. FOTO inital limitation 52%, current limitation 50%.   Pt will benefit from skilled therapeutic intervention in order to improve on the following deficits Abnormal gait;Pain;Impaired flexibility;Decreased strength;Increased edema   Rehab Potential Excellent   Clinical Impairments Affecting Rehab Potential Lt THA   PT Frequency 3x / week   PT Duration 6 weeks   PT Treatment/Interventions ADLs/Self Care Home Management;Electrical Stimulation;Therapeutic exercise;Stair training;Gait training;Neuromuscular re-education;Balance training;Patient/family education;Manual techniques;Vasopneumatic Device;Taping;Passive range of motion;Scar mobilization   PT Next Visit Plan TKE, Hip/knee strengthening, balance, ROM, modalities prn for edema/pain (review standing HS curls for proper form)    Consulted and Agree with Plan of Care Patient        Problem List Patient Active Problem List   Diagnosis Date Noted  . DJD (degenerative joint disease) of knee 09/24/2014  . COLONIC POLYPS, ADENOMATOUS, HX OF 10/07/2008  . HYPOTHYROIDISM 10/06/2008  . HYPERCHOLESTEROLEMIA 10/06/2008  . DEPRESSION 10/06/2008  . HYPERTENSION 10/06/2008  . HEMORRHOIDS 10/06/2008  . DIVERTICULAR DISEASE 10/06/2008  . OSTEOPOROSIS 10/06/2008  . DYSPNEA 10/06/2008  . CHEST DISCOMFORT 10/06/2008  . COLONOSCOPY, HX OF 10/06/2008  . ARTHROSCOPY, RIGHT KNEE, HX OF 10/06/2008    [name], PTA 10/24/2014, 11:23 AM  [facility] [address] [place], [place], [address] Phone: [phone]   Fax:  [phone]

## 2014-10-27 ENCOUNTER — Encounter: Payer: Medicare Other | Admitting: Physical Therapy

## 2014-10-29 ENCOUNTER — Ambulatory Visit: Payer: Medicare Other | Admitting: Physical Therapy

## 2014-10-29 ENCOUNTER — Encounter: Payer: Self-pay | Admitting: Physical Therapy

## 2014-10-29 DIAGNOSIS — M25661 Stiffness of right knee, not elsewhere classified: Secondary | ICD-10-CM | POA: Diagnosis not present

## 2014-10-29 DIAGNOSIS — M25561 Pain in right knee: Secondary | ICD-10-CM

## 2014-10-29 DIAGNOSIS — R269 Unspecified abnormalities of gait and mobility: Secondary | ICD-10-CM

## 2014-10-29 DIAGNOSIS — R600 Localized edema: Secondary | ICD-10-CM

## 2014-10-29 DIAGNOSIS — R531 Weakness: Secondary | ICD-10-CM

## 2014-10-29 NOTE — Therapy (Signed)
[facility] [place], [place], [address] Phone: [phone]   Fax:  [phone]  Physical Therapy Treatment  Patient Details  Name: [name] MRN: [id number] Date of Birth: 06-26-1933 Referring Provider:  Dione Housekeeper, MD  Encounter Date: 10/29/2014      PT End of Session - 10/29/14 1058    Visit Number 6   Number of Visits 18   Date for PT Re-Evaluation 11/24/14   PT Start Time 1028   PT Stop Time 1123   PT Time Calculation (min) 55 min   Activity Tolerance Patient tolerated treatment well   Behavior During Therapy [facility] for tasks assessed/performed      Past Medical History  Diagnosis Date  . Hypertension   . Arthritis   . Nocturia   . Hyperlipemia   . Hypothyroidism   . Anemia     hx -2013 blood transfusion  polyps    Past Surgical History  Procedure Laterality Date  . Fracture surgery      left leg  rod  . Knee arthroscopy Right   . Lumbar laminectomy/decompression microdiscectomy Right 04/10/2012    Procedure: LUMBAR LAMINECTOMY/DECOMPRESSION MICRODISCECTOMY 1 LEVEL;  Surgeon: Erline Levine, MD;  Location: [place] [facility];  Service: Neurosurgery;  Laterality: Right;  Right Lumbar three - four Microdiskectomy  . Joint replacement Left     hip  . Total knee arthroplasty Right 09/24/2014    Procedure: TOTAL KNEE ARTHROPLASTY;  Surgeon: Ninetta Lights, MD;  Location: [place];  Service: Orthopedics;  Laterality: Right;    There were no vitals filed for this visit.  Visit Diagnosis:  Knee stiffness, right  Abnormality of gait  Pain in right knee  Generalized weakness  Localized edema      Subjective Assessment - 10/29/14 1033    Subjective Reports no pain today only achiness to which she thinks is from weather.   Pertinent History L THA   Patient Stated Goals Get the knee working better   Currently in Pain? Yes   Pain Score 2    Pain Location Knee   Pain Orientation Right   Pain  Descriptors / Indicators Aching   Pain Type Surgical pain   Pain Frequency Intermittent   Aggravating Factors  weather or ROM   Pain Relieving Factors rest            [facility] PT Assessment - 10/29/14 0001    Assessment   Next MD Visit 11/04/2014  Dr. Kathryne Hitch   AROM   Overall AROM  Deficits   AROM Assessment Site Knee   Right/Left Knee Right   Right Knee Extension -3   Right Knee Flexion 116   PROM   Overall PROM  Within functional limits for tasks performed   PROM Assessment Site Knee   Right/Left Knee Right   Right Knee Extension 0   Right Knee Flexion 120                     [facility] Adult PT Treatment/Exercise - 10/29/14 0001    Knee/Hip Exercises: Stretches   Knee: Self-Stretch to increase Flexion Right;3 reps;30 seconds   Knee/Hip Exercises: Aerobic   Nustep L5 x15 min   Knee/Hip Exercises: Standing   Lateral Step Up Right;3 sets;10 reps;Step Height: 6"   Forward Step Up Right;3 sets;10 reps;Step Height: 6"   Rocker Board 3 minutes   Knee/Hip Exercises: Supine   Straight Leg Raise with External Rotation Strengthening;Right;3 sets;10 reps  Knee/Hip Exercises: Sidelying   Hip ABduction Strengthening;Right;3 sets;10 reps   Electrical Stimulation   Electrical Stimulation Location Right knee.   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IFC   Electrical Stimulation Parameters 1-10hz    Electrical Stimulation Goals Edema;Pain   Vasopneumatic   Number Minutes Vasopneumatic  15 minutes   Vasopnuematic Location  Knee   Vasopneumatic Pressure Medium   Manual Therapy   Manual Therapy Passive ROM   Passive ROM gentle low load holds for knee flexion and ext                  PT Short Term Goals - 10/20/14 1131    PT SHORT TERM GOAL #1   Title I with initial HEP   Time 2   Period Weeks   Status Achieved           PT Long Term Goals - 10/29/14 1111    PT LONG TERM GOAL #1   Title I with advanced HEP   Time 6   Period Weeks   Status On-going    PT LONG TERM GOAL #2   Title R knee ROM of 0-120 degrees to normalize ADLS   Time 6   Period Weeks   Status On-going  AROM -3-116 degrees (10/29/14)   PT LONG TERM GOAL #3   Title able to climb stairs with reciprocal gait   Time 6   Period Weeks   Status On-going   PT LONG TERM GOAL #4   Title demonstrate normal gait pattern without AD   Time 6   Period Weeks   Status On-going   PT LONG TERM GOAL #5   Title improved RLE strength to 4+/5    Time 6   Period Weeks   Status On-going               Plan - 10/29/14 1104    Clinical Impression Statement Patient continues to progress with overall improvement, not a lot of pain reported and continues to have improved ROM in right knee. Patient close to meeting ROM goals. Limitations are strength and full ROM.   Pt will benefit from skilled therapeutic intervention in order to improve on the following deficits Abnormal gait;Pain;Impaired flexibility;Decreased strength;Increased edema   Rehab Potential Excellent   Clinical Impairments Affecting Rehab Potential Lt THA   PT Frequency 3x / week   PT Duration 6 weeks   PT Treatment/Interventions ADLs/Self Care Home Management;Electrical Stimulation;Therapeutic exercise;Stair training;Gait training;Neuromuscular re-education;Balance training;Patient/family education;Manual techniques;Vasopneumatic Device;Taping;Passive range of motion;Scar mobilization   PT Next Visit Plan cont with POC (MD Percell Miller 11/04/14)   Consulted and Agree with Plan of Care Patient        Problem List Patient Active Problem List   Diagnosis Date Noted  . DJD (degenerative joint disease) of knee 09/24/2014  . COLONIC POLYPS, ADENOMATOUS, HX OF 10/07/2008  . HYPOTHYROIDISM 10/06/2008  . HYPERCHOLESTEROLEMIA 10/06/2008  . DEPRESSION 10/06/2008  . HYPERTENSION 10/06/2008  . HEMORRHOIDS 10/06/2008  . DIVERTICULAR DISEASE 10/06/2008  . OSTEOPOROSIS 10/06/2008  . DYSPNEA 10/06/2008  . CHEST DISCOMFORT  10/06/2008  . COLONOSCOPY, HX OF 10/06/2008  . ARTHROSCOPY, RIGHT KNEE, HX OF 10/06/2008    Lovene Maret P, PTA 10/29/2014, 11:31 AM  [facility] [address] [place], [place], [address] Phone: [phone]   Fax:  [phone]

## 2014-10-31 ENCOUNTER — Encounter: Payer: Self-pay | Admitting: Physical Therapy

## 2014-10-31 ENCOUNTER — Ambulatory Visit: Payer: Medicare Other | Admitting: Physical Therapy

## 2014-10-31 DIAGNOSIS — R269 Unspecified abnormalities of gait and mobility: Secondary | ICD-10-CM

## 2014-10-31 DIAGNOSIS — M25661 Stiffness of right knee, not elsewhere classified: Secondary | ICD-10-CM | POA: Diagnosis not present

## 2014-10-31 DIAGNOSIS — M25561 Pain in right knee: Secondary | ICD-10-CM

## 2014-10-31 DIAGNOSIS — R531 Weakness: Secondary | ICD-10-CM

## 2014-10-31 DIAGNOSIS — R600 Localized edema: Secondary | ICD-10-CM

## 2014-10-31 NOTE — Therapy (Signed)
[facility] [place], [place], [address] Phone: [phone]   Fax:  [phone]  Physical Therapy Treatment  Patient Details  Name: [name] MRN: [id number] Date of Birth: 01-23-1933 No Data Recorded  Encounter Date: 10/31/2014      PT End of Session - 10/31/14 1034    Visit Number 7   Number of Visits 18   Date for PT Re-Evaluation 11/24/14   PT Start Time 1030   PT Stop Time 1140   PT Time Calculation (min) 70 min   Activity Tolerance Patient tolerated treatment well   Behavior During Therapy [facility] for tasks assessed/performed      Past Medical History  Diagnosis Date  . Hypertension   . Arthritis   . Nocturia   . Hyperlipemia   . Hypothyroidism   . Anemia     hx -2013 blood transfusion  polyps    Past Surgical History  Procedure Laterality Date  . Fracture surgery      left leg  rod  . Knee arthroscopy Right   . Lumbar laminectomy/decompression microdiscectomy Right 04/10/2012    Procedure: LUMBAR LAMINECTOMY/DECOMPRESSION MICRODISCECTOMY 1 LEVEL;  Surgeon: Erline Levine, MD;  Location: [place] [facility];  Service: Neurosurgery;  Laterality: Right;  Right Lumbar three - four Microdiskectomy  . Joint replacement Left     hip  . Total knee arthroplasty Right 09/24/2014    Procedure: TOTAL KNEE ARTHROPLASTY;  Surgeon: Ninetta Lights, MD;  Location: [place];  Service: Orthopedics;  Laterality: Right;    There were no vitals filed for this visit.  Visit Diagnosis:  Knee stiffness, right  Abnormality of gait  Pain in right knee  Generalized weakness  Localized edema      Subjective Assessment - 10/31/14 1032    Subjective Reports that R knee feels a little stiff this morning but feels pretty good.   Pertinent History L THA   Patient Stated Goals Get the knee working better   Currently in Pain? Yes   Pain Score 5    Pain Location Knee   Pain Orientation Right   Pain Descriptors / Indicators  Other (Comment)  Stiffness only no pain   Pain Type Surgical pain            [facility] PT Assessment - 10/31/14 0001    Assessment   Medical Diagnosis R TKR   Onset Date/Surgical Date 09/24/14   Next MD Visit 11/04/2014  Dr. Kathryne Hitch   Precautions   Precautions Knee   Precaution Comments TKR protocol   ROM / Strength   AROM / PROM / Strength AROM   AROM   Overall AROM  Within functional limits for tasks performed;Deficits   AROM Assessment Site Knee   Right/Left Knee Right   Right Knee Extension -2   Right Knee Flexion 125                     [facility] Adult PT Treatment/Exercise - 10/31/14 0001    Ambulation/Gait   Stairs Yes   Stair Management Technique One rail Right;Alternating pattern;Forwards   Number of Stairs 8   Knee/Hip Exercises: Aerobic   Stationary Bike L1 x10 min; Seat 4   Nustep L5 x5 min   Knee/Hip Exercises: Standing   Terminal Knee Extension Limitations Pink XTS x30 reps   Lateral Step Up Right;3 sets;10 reps;Hand Hold: 2;Step Height: 6"   Forward Step Up Right;3 sets;10 reps;Hand Hold: 2;Step Height: 6"  Step Down Right;3 sets;10 reps;Step Height: 4"  Forward and lateral heel dot   Knee/Hip Exercises: Seated   Long Arc Quad Strengthening;Right;3 sets;10 reps;Weights   Long Arc Quad Weight 4 lbs.   Knee/Hip Exercises: Supine   Other Supine Knee/Hip Exercises Supine B hip abduction red theraband x30 reps   Knee/Hip Exercises: Sidelying   Hip ABduction Strengthening;Right;3 sets;10 reps   Modalities   Modalities Electrical Stimulation;Vasopneumatic   Electrical Stimulation   Electrical Stimulation Location Right knee.   Electrical Stimulation Action IFC   Electrical Stimulation Parameters 1-10 Hz x15 min   Electrical Stimulation Goals Edema;Pain   Vasopneumatic   Number Minutes Vasopneumatic  10 minutes   Vasopnuematic Location  Knee   Vasopneumatic Pressure Medium   Vasopneumatic Temperature  34   Manual Therapy   Manual  Therapy Passive ROM;Soft tissue mobilization   Soft tissue mobilization R patellar mobilizations into L/R, sup/inf; R incision mobilizations throughout incision in circles, L/R, sup/inf directions   Passive ROM PROM of the R knee into flex/ext with gentle holds at end range                PT Education - 10/31/14 1110    Education provided Yes   Education Details HEP- supine B hip abduction with red theraband   Person(s) Educated Patient   Methods Explanation;Tactile cues;Verbal cues;Handout   Comprehension Verbalized understanding;Verbal cues required;Tactile cues required          PT Short Term Goals - 10/20/14 1131    PT SHORT TERM GOAL #1   Title I with initial HEP   Time 2   Period Weeks   Status Achieved           PT Long Term Goals - 10/31/14 1035    PT LONG TERM GOAL #1   Title I with advanced HEP   Time 6   Period Weeks   Status On-going   PT LONG TERM GOAL #2   Title R knee ROM of 0-120 degrees to normalize ADLS   Time 6   Period Weeks   Status On-going  AROM R knee 2-125 degrees (10/31/14)   PT LONG TERM GOAL #3   Title able to climb stairs with reciprocal gait   Time 6   Period Weeks   Status Achieved   PT LONG TERM GOAL #4   Title demonstrate normal gait pattern without AD   Time 6   Period Weeks   Status On-going   PT LONG TERM GOAL #5   Title improved RLE strength to 4+/5    Time 6   Period Weeks   Status On-going               Plan - 10/31/14 1037    Clinical Impression Statement Patient tolerated treatment well today. Displays bilateral knee adduction during NuStep and stationary bike. Tolerated all therapeutic exercises well today without complaints of pain during any of the exercises. Required minimal to moderate multimodal cueing for proper technique of exercises as well as the reciprical pattern for stair ambulation. Ambulated the stairs well with no difficulty and one handrail at a reduced speed. Accepted new HEP for B  hip abduction and red theraband with no questions verbalized in the clinic. PROM of the R knee is observed as smooth with no popping noted during PROM. AROM of the R knee measured as 2-125 deg today in supine. Demonstrates good R patellar and incision mobilizations in all directions mobilized. Continues to present with small  scabs on the inferior scar and reported soreness over the inferior aspect of the incision. Normal modalities response noted following removal of the modalities. Denied R knee pain and experienced "very little" stiffness following today's treatment.   Pt will benefit from skilled therapeutic intervention in order to improve on the following deficits Abnormal gait;Pain;Impaired flexibility;Decreased strength;Increased edema   Rehab Potential Excellent   Clinical Impairments Affecting Rehab Potential Lt THA   PT Frequency 3x / week   PT Duration 6 weeks   PT Treatment/Interventions ADLs/Self Care Home Management;Electrical Stimulation;Therapeutic exercise;Stair training;Gait training;Neuromuscular re-education;Balance training;Patient/family education;Manual techniques;Vasopneumatic Device;Taping;Passive range of motion;Scar mobilization   PT Next Visit Plan cont with POC (MD Percell Miller 11/04/14). Consider gait training next treatment to assess gait goal.   Consulted and Agree with Plan of Care Patient        Problem List Patient Active Problem List   Diagnosis Date Noted  . DJD (degenerative joint disease) of knee 09/24/2014  . COLONIC POLYPS, ADENOMATOUS, HX OF 10/07/2008  . HYPOTHYROIDISM 10/06/2008  . HYPERCHOLESTEROLEMIA 10/06/2008  . DEPRESSION 10/06/2008  . HYPERTENSION 10/06/2008  . HEMORRHOIDS 10/06/2008  . DIVERTICULAR DISEASE 10/06/2008  . OSTEOPOROSIS 10/06/2008  . DYSPNEA 10/06/2008  . CHEST DISCOMFORT 10/06/2008  . COLONOSCOPY, HX OF 10/06/2008  . ARTHROSCOPY, RIGHT KNEE, HX OF 10/06/2008    [name], PTA 10/31/2014, 11:45 AM  [facility] [address] [place], [place], [address] Phone: [phone]   Fax:  [phone]  Name: [name] MRN: [id number] Date of Birth: 12-Aug-1933

## 2014-10-31 NOTE — Patient Instructions (Signed)
Strengthening: Hip Abductor - Resisted    With band looped around both legs above knees, push thighs apart. Repeat __10__ times per set. Do __3__ sets per session. Do __3__ sessions per day.  http://orth.exer.us/688   Copyright  VHI. All rights reserved.

## 2014-11-03 ENCOUNTER — Ambulatory Visit: Payer: Medicare Other | Admitting: Physical Therapy

## 2014-11-03 ENCOUNTER — Encounter: Payer: Self-pay | Admitting: Physical Therapy

## 2014-11-03 DIAGNOSIS — M25661 Stiffness of right knee, not elsewhere classified: Secondary | ICD-10-CM

## 2014-11-03 DIAGNOSIS — R600 Localized edema: Secondary | ICD-10-CM

## 2014-11-03 DIAGNOSIS — M25561 Pain in right knee: Secondary | ICD-10-CM

## 2014-11-03 DIAGNOSIS — R269 Unspecified abnormalities of gait and mobility: Secondary | ICD-10-CM

## 2014-11-03 DIAGNOSIS — R531 Weakness: Secondary | ICD-10-CM

## 2014-11-03 NOTE — Therapy (Signed)
[facility] [place], [place], [address] Phone: [phone]   Fax:  [phone]  Physical Therapy Treatment  Patient Details  Name: [name] MRN: [id number] Date of Birth: 1933-10-04 No Data Recorded  Encounter Date: 11/03/2014      PT End of Session - 11/03/14 1108    Visit Number 8   Number of Visits 18   Date for PT Re-Evaluation 11/24/14   PT Start Time 1029   PT Stop Time 1125   PT Time Calculation (min) 56 min   Activity Tolerance Patient tolerated treatment well   Behavior During Therapy [facility] for tasks assessed/performed      Past Medical History  Diagnosis Date  . Hypertension   . Arthritis   . Nocturia   . Hyperlipemia   . Hypothyroidism   . Anemia     hx -2013 blood transfusion  polyps    Past Surgical History  Procedure Laterality Date  . Fracture surgery      left leg  rod  . Knee arthroscopy Right   . Lumbar laminectomy/decompression microdiscectomy Right 04/10/2012    Procedure: LUMBAR LAMINECTOMY/DECOMPRESSION MICRODISCECTOMY 1 LEVEL;  Surgeon: Erline Levine, MD;  Location: [facility];  Service: Neurosurgery;  Laterality: Right;  Right Lumbar three - four Microdiskectomy  . Joint replacement Left     hip  . Total knee arthroplasty Right 09/24/2014    Procedure: TOTAL KNEE ARTHROPLASTY;  Surgeon: Ninetta Lights, MD;  Location: [facility];  Service: Orthopedics;  Laterality: Right;    There were no vitals filed for this visit.  Visit Diagnosis:  Knee stiffness, right  Abnormality of gait  Pain in right knee  Generalized weakness  Localized edema      Subjective Assessment - 11/03/14 1032    Subjective Reports that R knee feels a little stiff this morning    Pertinent History L THA   Patient Stated Goals Get the knee working better   Currently in Pain? Yes   Pain Score 5    Pain Location Knee   Pain Orientation Right   Pain Descriptors / Indicators --  stiff   Pain  Type Surgical pain   Pain Onset More than a month ago   Pain Frequency Intermittent   Aggravating Factors  ROM    Pain Relieving Factors rest            [facility] PT Assessment - 11/03/14 0001    ROM / Strength   AROM / PROM / Strength Strength;AROM;PROM   AROM   Overall AROM  Within functional limits for tasks performed;Deficits   AROM Assessment Site Knee   Right/Left Knee Right   Right Knee Extension -2   Right Knee Flexion 125   PROM   Overall PROM  Within functional limits for tasks performed   PROM Assessment Site Knee   Right/Left Knee Right   Right Knee Extension 0   Right Knee Flexion 120   Strength   Overall Strength Deficits;Within functional limits for tasks performed   Strength Assessment Site Knee   Right Hip ABduction 4+/5   Right Knee Flexion 4/5   Right Knee Extension 4+/5                     [facility] Adult PT Treatment/Exercise - 11/03/14 0001    Knee/Hip Exercises: Aerobic   Stationary Bike L1 x 15mn   Nustep L5 x10 min   Knee/Hip Exercises: Standing   Terminal Knee  Extension Limitations Pink XTS x30 reps   Lateral Step Up Right;3 sets;10 reps;Hand Hold: 2;Step Height: 6"   Forward Step Up Right;3 sets;10 reps;Hand Hold: 2;Step Height: 6"   Step Down Right;3 sets;10 reps;Step Height: 4"  heel dot   Rocker Board 3 minutes   Electrical Stimulation   Electrical Stimulation Location Right knee.   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[employer]   Electrical Stimulation Parameters 1-10hz    Electrical Stimulation Goals Edema;Pain   Vasopneumatic   Number Minutes Vasopneumatic  15 minutes   Vasopnuematic Location  Knee   Vasopneumatic Pressure Medium   Manual Therapy   Manual Therapy Passive ROM   Passive ROM PROM for R knee ext with gentle holds at end range                  PT Short Term Goals - 10/20/14 1131    PT SHORT TERM GOAL #1   Title I with initial HEP   Time 2   Period Weeks   Status Achieved           PT Long Term Goals  - 11/03/14 1100    PT LONG TERM GOAL #1   Title I with advanced HEP   Time 6   Period Weeks   Status Achieved   PT LONG TERM GOAL #2   Title R knee ROM of 0-120 degrees to normalize ADLS   Time 6   Period Weeks   Status Achieved  PROM 0-125, AROM -2-120 degrees   PT LONG TERM GOAL #3   Title able to climb stairs with reciprocal gait   Time 6   Period Weeks   Status Achieved   PT LONG TERM GOAL #4   Title demonstrate normal gait pattern without AD   Time 6   Period Weeks   Status Achieved   PT LONG TERM GOAL #5   Title improved RLE strength to 4+/5    Time 6   Period Weeks   Status On-going               Plan - 11/03/14 1110    Clinical Impression Statement Patient progessing with all activities and has met all current goals except strength goal partial met. patient has improved with ROM and met goal, patient able to ambulate with no assisitive device and no deviation and patient is independent with HEP and all ADL's.   Pt will benefit from skilled therapeutic intervention in order to improve on the following deficits Abnormal gait;Pain;Impaired flexibility;Decreased strength;Increased edema   Rehab Potential Excellent   Clinical Impairments Affecting Rehab Potential Lt THA   PT Frequency 3x / week   PT Duration 6 weeks   PT Treatment/Interventions ADLs/Self Care Home Management;Electrical Stimulation;Therapeutic exercise;Stair training;Gait training;Neuromuscular re-education;Balance training;Patient/family education;Manual techniques;Vasopneumatic Device;Taping;Passive range of motion;Scar mobilization   PT Next Visit Plan cont per MD Percell Miller 11/04/14 KX added   Consulted and Agree with Plan of Care Patient        Problem List Patient Active Problem List   Diagnosis Date Noted  . DJD (degenerative joint disease) of knee 09/24/2014  . COLONIC POLYPS, ADENOMATOUS, HX OF 10/07/2008  . HYPOTHYROIDISM 10/06/2008  . HYPERCHOLESTEROLEMIA 10/06/2008  . DEPRESSION  10/06/2008  . HYPERTENSION 10/06/2008  . HEMORRHOIDS 10/06/2008  . DIVERTICULAR DISEASE 10/06/2008  . OSTEOPOROSIS 10/06/2008  . DYSPNEA 10/06/2008  . CHEST DISCOMFORT 10/06/2008  . COLONOSCOPY, HX OF 10/06/2008  . ARTHROSCOPY, RIGHT KNEE, HX OF 10/06/2008   APPLEGATE, [place], PTA 11/03/2014, 3:06 PM  Mali Applegate MPT [facility] [place], [place], [address] Phone: [phone]   Fax:  [phone]  Name: [name] MRN: [id number] Date of Birth: 1933/01/22

## 2014-11-03 NOTE — Therapy (Signed)
[facility] [place], [place], [address] Phone: [phone]   Fax:  [phone]  Physical Therapy Treatment  Patient Details  Name: [name] MRN: [id number] Date of Birth: [birth year] No Data Recorded  Encounter Date: 11/03/2014      PT End of Session - 11/03/14 1108    Visit Number 8   Number of Visits 18   Date for PT Re-Evaluation 11/24/14   PT Start Time 1029   PT Stop Time 1125   PT Time Calculation (min) 56 min   Activity Tolerance Patient tolerated treatment well   Behavior During Therapy [facility] for tasks assessed/performed      Past Medical History  Diagnosis Date  . Hypertension   . Arthritis   . Nocturia   . Hyperlipemia   . Hypothyroidism   . Anemia     hx -2013 blood transfusion  polyps    Past Surgical History  Procedure Laterality Date  . Fracture surgery      left leg  rod  . Knee arthroscopy Right   . Lumbar laminectomy/decompression microdiscectomy Right 04/10/2012    Procedure: LUMBAR LAMINECTOMY/DECOMPRESSION MICRODISCECTOMY 1 LEVEL;  Surgeon: Erline Levine, MD;  Location: [facility];  Service: Neurosurgery;  Laterality: Right;  Right Lumbar three - four Microdiskectomy  . Joint replacement Left     hip  . Total knee arthroplasty Right 09/24/2014    Procedure: TOTAL KNEE ARTHROPLASTY;  Surgeon: Ninetta Lights, MD;  Location: [facility];  Service: Orthopedics;  Laterality: Right;    There were no vitals filed for this visit.  Visit Diagnosis:  Knee stiffness, right  Abnormality of gait  Pain in right knee  Generalized weakness  Localized edema      Subjective Assessment - 11/03/14 1032    Subjective Reports that R knee feels a little stiff this morning    Pertinent History L THA   Patient Stated Goals Get the knee working better   Currently in Pain? Yes   Pain Score 5    Pain Location Knee   Pain Orientation Right   Pain Descriptors / Indicators --  stiff   Pain  Type Surgical pain   Pain Onset More than a month ago   Pain Frequency Intermittent   Aggravating Factors  ROM    Pain Relieving Factors rest            [facility] PT Assessment - 11/03/14 0001    ROM / Strength   AROM / PROM / Strength Strength;AROM;PROM   AROM   Overall AROM  Within functional limits for tasks performed;Deficits   AROM Assessment Site Knee   Right/Left Knee Right   Right Knee Extension -2   Right Knee Flexion 125   PROM   Overall PROM  Within functional limits for tasks performed   PROM Assessment Site Knee   Right/Left Knee Right   Right Knee Extension 0   Right Knee Flexion 120   Strength   Overall Strength Deficits;Within functional limits for tasks performed   Strength Assessment Site Knee   Right Hip ABduction 4+/5   Right Knee Flexion 4/5   Right Knee Extension 4+/5                     [facility] Adult PT Treatment/Exercise - 11/03/14 0001    Knee/Hip Exercises: Aerobic   Stationary Bike L1 x 81mn   Nustep L5 x10 min   Knee/Hip Exercises: Standing   Terminal Knee  Extension Limitations Pink XTS x30 reps   Lateral Step Up Right;3 sets;10 reps;Hand Hold: 2;Step Height: 6"   Forward Step Up Right;3 sets;10 reps;Hand Hold: 2;Step Height: 6"   Step Down Right;3 sets;10 reps;Step Height: 4"  heel dot   Rocker Board 3 minutes   Electrical Stimulation   Electrical Stimulation Location Right knee.   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[employer]   Electrical Stimulation Parameters 1-10hz    Electrical Stimulation Goals Edema;Pain   Vasopneumatic   Number Minutes Vasopneumatic  15 minutes   Vasopnuematic Location  Knee   Vasopneumatic Pressure Medium   Manual Therapy   Manual Therapy Passive ROM   Passive ROM PROM for R knee ext with gentle holds at end range                  PT Short Term Goals - 10/20/14 1131    PT SHORT TERM GOAL #1   Title I with initial HEP   Time 2   Period Weeks   Status Achieved           PT Long Term Goals  - 11/03/14 1100    PT LONG TERM GOAL #1   Title I with advanced HEP   Time 6   Period Weeks   Status Achieved   PT LONG TERM GOAL #2   Title R knee ROM of 0-120 degrees to normalize ADLS   Time 6   Period Weeks   Status Achieved  PROM 0-125, AROM -2-120 degrees   PT LONG TERM GOAL #3   Title able to climb stairs with reciprocal gait   Time 6   Period Weeks   Status Achieved   PT LONG TERM GOAL #4   Title demonstrate normal gait pattern without AD   Time 6   Period Weeks   Status Achieved   PT LONG TERM GOAL #5   Title improved RLE strength to 4+/5    Time 6   Period Weeks   Status On-going               Plan - 11/03/14 1110    Clinical Impression Statement Patient progessing with all activities and has met all current goals except strength goal partial met. patient has improved with ROM and met goal, patient able to ambulate with no assisitive device and no deviation and patient is independent with HEP and all ADL's.   Pt will benefit from skilled therapeutic intervention in order to improve on the following deficits Abnormal gait;Pain;Impaired flexibility;Decreased strength;Increased edema   Rehab Potential Excellent   Clinical Impairments Affecting Rehab Potential Lt THA   PT Frequency 3x / week   PT Duration 6 weeks   PT Treatment/Interventions ADLs/Self Care Home Management;Electrical Stimulation;Therapeutic exercise;Stair training;Gait training;Neuromuscular re-education;Balance training;Patient/family education;Manual techniques;Vasopneumatic Device;Taping;Passive range of motion;Scar mobilization   PT Next Visit Plan cont per MD Percell Miller 11/04/14 KX added   Consulted and Agree with Plan of Care Patient        Problem List Patient Active Problem List   Diagnosis Date Noted  . DJD (degenerative joint disease) of knee 09/24/2014  . COLONIC POLYPS, ADENOMATOUS, HX OF 10/07/2008  . HYPOTHYROIDISM 10/06/2008  . HYPERCHOLESTEROLEMIA 10/06/2008  . DEPRESSION  10/06/2008  . HYPERTENSION 10/06/2008  . HEMORRHOIDS 10/06/2008  . DIVERTICULAR DISEASE 10/06/2008  . OSTEOPOROSIS 10/06/2008  . DYSPNEA 10/06/2008  . CHEST DISCOMFORT 10/06/2008  . COLONOSCOPY, HX OF 10/06/2008  . ARTHROSCOPY, RIGHT KNEE, HX OF 10/06/2008    Marshal Eskew, [place] MPT 11/03/2014, 3:08 PM  [facility] [place], [place], [address] Phone: [phone]   Fax:  [phone]  Name: [name] MRN: [id number] Date of Birth: October 07, 1933

## 2014-11-05 ENCOUNTER — Ambulatory Visit: Payer: Medicare Other | Admitting: Physical Therapy

## 2014-11-05 ENCOUNTER — Encounter: Payer: Self-pay | Admitting: Physical Therapy

## 2014-11-05 DIAGNOSIS — M25561 Pain in right knee: Secondary | ICD-10-CM

## 2014-11-05 DIAGNOSIS — M25661 Stiffness of right knee, not elsewhere classified: Secondary | ICD-10-CM | POA: Diagnosis not present

## 2014-11-05 DIAGNOSIS — R531 Weakness: Secondary | ICD-10-CM

## 2014-11-05 DIAGNOSIS — R600 Localized edema: Secondary | ICD-10-CM

## 2014-11-05 DIAGNOSIS — R269 Unspecified abnormalities of gait and mobility: Secondary | ICD-10-CM

## 2014-11-05 NOTE — Therapy (Signed)
[facility] [place], [place], [address] Phone: [phone]   Fax:  [phone]  Physical Therapy Treatment  Patient Details  Name: [name] MRN: [id number] Date of Birth: January 29, 1933 Referring Provider: Dr. Amada Jupiter  Encounter Date: 11/05/2014      PT End of Session - 11/05/14 1032    Visit Number 9   Number of Visits 18   Date for PT Re-Evaluation 11/24/14   PT Start Time 1032   PT Stop Time 1125   PT Time Calculation (min) 53 min   Activity Tolerance Patient tolerated treatment well   Behavior During Therapy [facility] for tasks assessed/performed      Past Medical History  Diagnosis Date  . Hypertension   . Arthritis   . Nocturia   . Hyperlipemia   . Hypothyroidism   . Anemia     hx -2013 blood transfusion  polyps    Past Surgical History  Procedure Laterality Date  . Fracture surgery      left leg  rod  . Knee arthroscopy Right   . Lumbar laminectomy/decompression microdiscectomy Right 04/10/2012    Procedure: LUMBAR LAMINECTOMY/DECOMPRESSION MICRODISCECTOMY 1 LEVEL;  Surgeon: Erline Levine, MD;  Location: [facility];  Service: Neurosurgery;  Laterality: Right;  Right Lumbar three - four Microdiskectomy  . Joint replacement Left     hip  . Total knee arthroplasty Right 09/24/2014    Procedure: TOTAL KNEE ARTHROPLASTY;  Surgeon: Ninetta Lights, MD;  Location: [facility];  Service: Orthopedics;  Laterality: Right;    There were no vitals filed for this visit.  Visit Diagnosis:  Knee stiffness, right  Abnormality of gait  Pain in right knee  Generalized weakness  Localized edema      Subjective Assessment - 11/05/14 1042    Subjective Reports that MD said he was pleased with progress and said to continue therapy until she achieves full knee extension.   Pertinent History L THA   Patient Stated Goals Get the knee working better   Currently in Pain? No/denies            [facility] PT  Assessment - 11/05/14 0001    Assessment   Medical Diagnosis R TKR   Referring Provider Dr. Amada Jupiter   Onset Date/Surgical Date 09/24/14   Next MD Visit 12/16/2014   Precautions   Precautions Knee   Precaution Comments TKR protocol   ROM / Strength   AROM / PROM / Strength AROM   AROM   Overall AROM  Deficits   AROM Assessment Site Knee   Right/Left Knee Right   Right Knee Extension -3                     [facility] Adult PT Treatment/Exercise - 11/05/14 0001    Knee/Hip Exercises: Aerobic   Stationary Bike L1 x12 min   Knee/Hip Exercises: Standing   Lateral Step Up Right;3 sets;10 reps;Hand Hold: 2;Step Height: 6"   Forward Step Up Right;3 sets;10 reps;Hand Hold: 2;Step Height: 6"   Step Down Right;3 sets;10 reps;Step Height: 4"   Rocker Board 3 minutes   Knee/Hip Exercises: Seated   Long Arc Quad Strengthening;Right;3 sets;10 reps;Weights   Long Arc Quad Weight 3 lbs.   Modalities   Modalities Network engineer Stimulation Location Right knee.   Electrical Stimulation Action [employer]   Electrical Stimulation Parameters 1-10 Hz x15 min   Electrical Stimulation Goals Edema;Pain  Vasopneumatic   Number Minutes Vasopneumatic  15 minutes   Vasopnuematic Location  Knee   Vasopneumatic Pressure Medium   Vasopneumatic Temperature  68   Manual Therapy   Manual Therapy Passive ROM;Soft tissue mobilization   Soft tissue mobilization R patellar mobilizations into L/R, sup/inf; R incision mobilizations throughout incision in circles, L/R, sup/inf directions   Passive ROM PROM for R knee ext with gentle holds at end range                  PT Short Term Goals - 10/20/14 1131    PT SHORT TERM GOAL #1   Title I with initial HEP   Time 2   Period Weeks   Status Achieved           PT Long Term Goals - 11/03/14 1100    PT LONG TERM GOAL #1   Title I with advanced HEP   Time 6   Period Weeks    Status Achieved   PT LONG TERM GOAL #2   Title R knee ROM of 0-120 degrees to normalize ADLS   Time 6   Period Weeks   Status Achieved  PROM 0-125, AROM -2-120 degrees   PT LONG TERM GOAL #3   Title able to climb stairs with reciprocal gait   Time 6   Period Weeks   Status Achieved   PT LONG TERM GOAL #4   Title demonstrate normal gait pattern without AD   Time 6   Period Weeks   Status Achieved   PT LONG TERM GOAL #5   Title improved RLE strength to 4+/5    Time 6   Period Weeks   Status On-going               Plan - 11/05/14 1111    Clinical Impression Statement Patient tolerated treatment very well without complaint of increased pain in R knee. Completes all therapeutic exercises well with minimal mulitmodal cueing for exercise technique. R knee extension AROM is measured as 3 deg from full extension. Demonstrates normal R knee incision and patellar mobility. Normal modalities response noted following removal of the modaliites. Denied pain following today's treatment.   Pt will benefit from skilled therapeutic intervention in order to improve on the following deficits Abnormal gait;Pain;Impaired flexibility;Decreased strength;Increased edema   Rehab Potential Excellent   Clinical Impairments Affecting Rehab Potential Lt THA   PT Frequency 3x / week   PT Duration 6 weeks   PT Treatment/Interventions ADLs/Self Care Home Management;Electrical Stimulation;Therapeutic exercise;Stair training;Gait training;Neuromuscular re-education;Balance training;Patient/family education;Manual techniques;Vasopneumatic Device;Taping;Passive range of motion;Scar mobilization   PT Next Visit Plan Initate machine knee strengthening next treatment and emphasis knee extension   Consulted and Agree with Plan of Care Patient        Problem List Patient Active Problem List   Diagnosis Date Noted  . DJD (degenerative joint disease) of knee 09/24/2014  . COLONIC POLYPS, ADENOMATOUS, HX OF  10/07/2008  . HYPOTHYROIDISM 10/06/2008  . HYPERCHOLESTEROLEMIA 10/06/2008  . DEPRESSION 10/06/2008  . HYPERTENSION 10/06/2008  . HEMORRHOIDS 10/06/2008  . DIVERTICULAR DISEASE 10/06/2008  . OSTEOPOROSIS 10/06/2008  . DYSPNEA 10/06/2008  . CHEST DISCOMFORT 10/06/2008  . COLONOSCOPY, HX OF 10/06/2008  . ARTHROSCOPY, RIGHT KNEE, HX OF 10/06/2008    [name], PTA 11/05/2014, 11:33 AM  [facility] [address] [place], [place], [address] Phone: [phone]   Fax:  [phone]  Name: GALI SPINNEY MRN: [id number] Date of Birth: [birth year]

## 2014-11-10 ENCOUNTER — Ambulatory Visit: Payer: Medicare Other | Admitting: Physical Therapy

## 2014-11-10 ENCOUNTER — Encounter: Payer: Self-pay | Admitting: Physical Therapy

## 2014-11-10 DIAGNOSIS — M25561 Pain in right knee: Secondary | ICD-10-CM

## 2014-11-10 DIAGNOSIS — R269 Unspecified abnormalities of gait and mobility: Secondary | ICD-10-CM

## 2014-11-10 DIAGNOSIS — M25661 Stiffness of right knee, not elsewhere classified: Secondary | ICD-10-CM

## 2014-11-10 DIAGNOSIS — R531 Weakness: Secondary | ICD-10-CM

## 2014-11-10 DIAGNOSIS — R600 Localized edema: Secondary | ICD-10-CM

## 2014-11-10 NOTE — Therapy (Signed)
[facility], [place], [address] Phone: [phone]   Fax:  [phone]  Physical Therapy Treatment  Patient Details  Name: [name] MRN: [id number] Date of Birth: [birth year] Referring Provider: Dr. Amada Jupiter  Encounter Date: 11/10/2014      PT End of Session - 11/10/14 1516    Visit Number 10   Number of Visits 18   Date for PT Re-Evaluation 11/24/14   PT Start Time 0177   PT Stop Time 1543   PT Time Calculation (min) 28 min   Activity Tolerance Patient tolerated treatment well   Behavior During Therapy [facility] for tasks assessed/performed      Past Medical History  Diagnosis Date  . Hypertension   . Arthritis   . Nocturia   . Hyperlipemia   . Hypothyroidism   . Anemia     hx -2013 blood transfusion  polyps    Past Surgical History  Procedure Laterality Date  . Fracture surgery      left leg  rod  . Knee arthroscopy Right   . Lumbar laminectomy/decompression microdiscectomy Right 04/10/2012    Procedure: LUMBAR LAMINECTOMY/DECOMPRESSION MICRODISCECTOMY 1 LEVEL;  Surgeon: Erline Levine, MD;  Location: [facility];  Service: Neurosurgery;  Laterality: Right;  Right Lumbar three - four Microdiskectomy  . Joint replacement Left     hip  . Total knee arthroplasty Right 09/24/2014    Procedure: TOTAL KNEE ARTHROPLASTY;  Surgeon: Ninetta Lights, MD;  Location: [facility];  Service: Orthopedics;  Laterality: Right;    There were no vitals filed for this visit.  Visit Diagnosis:  Knee stiffness, right  Abnormality of gait  Pain in right knee  Localized edema  Generalized weakness      Subjective Assessment - 11/10/14 1515    Subjective Reports that her and her husband have been on concrete putting together a slide for her grandchildren and has soreness. Took a pain pill prior to therapy today.   Pertinent History L THA   Patient Stated Goals Get the knee working better   Currently in Pain?  Yes   Pain Score 2    Pain Location Knee   Pain Orientation Right   Pain Descriptors / Indicators Sore   Pain Type Surgical pain   Pain Onset More than a month ago            [facility] PT Assessment - 11/10/14 0001    Assessment   Medical Diagnosis R TKR   Onset Date/Surgical Date 09/24/14   Next MD Visit 12/16/2014   Precautions   Precautions Knee   Precaution Comments TKR protocol   ROM / Strength   AROM / PROM / Strength AROM   AROM   Overall AROM  Within functional limits for tasks performed   AROM Assessment Site Knee   Right/Left Knee Right   Right Knee Extension 0   Right Knee Flexion 120   Strength   Overall Strength Deficits;Within functional limits for tasks performed   Strength Assessment Site Knee   Right/Left Hip Right   Right Knee Flexion 4+/5   Right Knee Extension 5/5                     [facility] Adult PT Treatment/Exercise - 11/10/14 0001    Knee/Hip Exercises: Aerobic   Stationary Bike L1 x10 min, seat 5   Knee/Hip Exercises: Standing   Step Down Right;3 sets;10 reps;Step Height: 6";Hand Hold: 2  Forward  and lateral heel dot   Rocker Board 3 minutes   Other Standing Knee Exercises R HS curl 3# x20 reps   Knee/Hip Exercises: Seated   Long Arc Quad Strengthening;Right;3 sets;10 reps;Weights   Long Arc Quad Weight 3 lbs.   Knee/Hip Exercises: Sidelying   Hip ABduction Strengthening;Right;2 sets;10 reps                  PT Short Term Goals - 10/20/14 1131    PT SHORT TERM GOAL #1   Title I with initial HEP   Time 2   Period Weeks   Status Achieved           PT Long Term Goals - 11/10/14 1548    PT LONG TERM GOAL #1   Title I with advanced HEP   Time 6   Period Weeks   Status Achieved   PT LONG TERM GOAL #2   Title R knee ROM of 0-120 degrees to normalize ADLS   Time 6   Period Weeks   Status Achieved  R knee AROM 0-120 deg 11/10/2014   PT LONG TERM GOAL #3   Title able to climb stairs with reciprocal gait    Time 6   Period Weeks   Status Achieved   PT LONG TERM GOAL #4   Title demonstrate normal gait pattern without AD   Time 6   Period Weeks   Status Achieved   PT LONG TERM GOAL #5   Title improved RLE strength to 4+/5    Time 6   Period Weeks   Status Achieved  R knee ext 5/5, R knee flex 4+/5 MMT               Plan - 11/10/14 1544    Clinical Impression Statement Patient has progressed well with PT and achieved all goals set at evaluation. AROM of R knee measured as 0-120 deg today in supine. R knee ext MMT 5/5, R knee flex 4+/5. Completed all therapeutic exercises well with only minimal mulitmodal cueing for correct exercise technqiue. Denied any modaliities today and was encouraged to contact clinic or doctor for any questions.   Pt will benefit from skilled therapeutic intervention in order to improve on the following deficits Abnormal gait;Pain;Impaired flexibility;Decreased strength;Increased edema   Rehab Potential Excellent   Clinical Impairments Affecting Rehab Potential Lt THA   PT Frequency 3x / week   PT Duration 6 weeks   PT Treatment/Interventions ADLs/Self Care Home Management;Electrical Stimulation;Therapeutic exercise;Stair training;Gait training;Neuromuscular re-education;Balance training;Patient/family education;Manual techniques;Vasopneumatic Device;Taping;Passive range of motion;Scar mobilization   PT Next Visit Plan Communicate need for D/C summary to MPT.   Consulted and Agree with Plan of Care Patient        Problem List Patient Active Problem List   Diagnosis Date Noted  . DJD (degenerative joint disease) of knee 09/24/2014  . COLONIC POLYPS, ADENOMATOUS, HX OF 10/07/2008  . HYPOTHYROIDISM 10/06/2008  . HYPERCHOLESTEROLEMIA 10/06/2008  . DEPRESSION 10/06/2008  . HYPERTENSION 10/06/2008  . HEMORRHOIDS 10/06/2008  . DIVERTICULAR DISEASE 10/06/2008  . OSTEOPOROSIS 10/06/2008  . DYSPNEA 10/06/2008  . CHEST DISCOMFORT 10/06/2008  . COLONOSCOPY,  HX OF 10/06/2008  . ARTHROSCOPY, RIGHT KNEE, HX OF 10/06/2008    Ahmed Prima, PTA 11/10/2014 3:51 PM  [facility], [place], [address] Phone: [phone]   Fax:  [phone]  Name: [name] MRN: [id number] Date of Birth: 08-Jun-1933

## 2014-11-10 NOTE — Therapy (Addendum)
[facility], [place], [address] Phone: [phone]   Fax:  [phone]  Physical Therapy Treatment  Patient Details  Name: [name] MRN: [id number] Date of Birth: [birth year] Referring Provider: Dr. Amada Jupiter  Encounter Date: 11/10/2014      PT End of Session - 11/10/14 1516    Visit Number 10   Number of Visits 18   Date for PT Re-Evaluation 11/24/14   PT Start Time 0177   PT Stop Time 1543   PT Time Calculation (min) 28 min   Activity Tolerance Patient tolerated treatment well   Behavior During Therapy [facility] for tasks assessed/performed      Past Medical History  Diagnosis Date  . Hypertension   . Arthritis   . Nocturia   . Hyperlipemia   . Hypothyroidism   . Anemia     hx -2013 blood transfusion  polyps    Past Surgical History  Procedure Laterality Date  . Fracture surgery      left leg  rod  . Knee arthroscopy Right   . Lumbar laminectomy/decompression microdiscectomy Right 04/10/2012    Procedure: LUMBAR LAMINECTOMY/DECOMPRESSION MICRODISCECTOMY 1 LEVEL;  Surgeon: Erline Levine, MD;  Location: [facility];  Service: Neurosurgery;  Laterality: Right;  Right Lumbar three - four Microdiskectomy  . Joint replacement Left     hip  . Total knee arthroplasty Right 09/24/2014    Procedure: TOTAL KNEE ARTHROPLASTY;  Surgeon: Ninetta Lights, MD;  Location: [facility];  Service: Orthopedics;  Laterality: Right;    There were no vitals filed for this visit.  Visit Diagnosis:  Knee stiffness, right  Abnormality of gait  Pain in right knee  Localized edema  Generalized weakness      Subjective Assessment - 11/10/14 1515    Subjective Reports that her and her husband have been on concrete putting together a slide for her grandchildren and has soreness. Took a pain pill prior to therapy today.   Pertinent History L THA   Patient Stated Goals Get the knee working better   Currently in Pain?  Yes   Pain Score 2    Pain Location Knee   Pain Orientation Right   Pain Descriptors / Indicators Sore   Pain Type Surgical pain   Pain Onset More than a month ago            [facility] PT Assessment - 11/10/14 0001    Assessment   Medical Diagnosis R TKR   Onset Date/Surgical Date 09/24/14   Next MD Visit 12/16/2014   Precautions   Precautions Knee   Precaution Comments TKR protocol   ROM / Strength   AROM / PROM / Strength AROM   AROM   Overall AROM  Within functional limits for tasks performed   AROM Assessment Site Knee   Right/Left Knee Right   Right Knee Extension 0   Right Knee Flexion 120   Strength   Overall Strength Deficits;Within functional limits for tasks performed   Strength Assessment Site Knee   Right/Left Hip Right   Right Knee Flexion 4+/5   Right Knee Extension 5/5                     [facility] Adult PT Treatment/Exercise - 11/10/14 0001    Knee/Hip Exercises: Aerobic   Stationary Bike L1 x10 min, seat 5   Knee/Hip Exercises: Standing   Step Down Right;3 sets;10 reps;Step Height: 6";Hand Hold: 2  Forward  and lateral heel dot   Rocker Board 3 minutes   Other Standing Knee Exercises R HS curl 3# x20 reps   Knee/Hip Exercises: Seated   Long Arc Quad Strengthening;Right;3 sets;10 reps;Weights   Long Arc Quad Weight 3 lbs.   Knee/Hip Exercises: Sidelying   Hip ABduction Strengthening;Right;2 sets;10 reps                  PT Short Term Goals - 10/20/14 1131    PT SHORT TERM GOAL #1   Title I with initial HEP   Time 2   Period Weeks   Status Achieved           PT Long Term Goals - 27-Nov-2014 1548    PT LONG TERM GOAL #1   Title I with advanced HEP   Time 6   Period Weeks   Status Achieved   PT LONG TERM GOAL #2   Title R knee ROM of 0-120 degrees to normalize ADLS   Time 6   Period Weeks   Status Achieved  R knee AROM 0-120 deg November 27, 2014   PT LONG TERM GOAL #3   Title able to climb stairs with reciprocal gait    Time 6   Period Weeks   Status Achieved   PT LONG TERM GOAL #4   Title demonstrate normal gait pattern without AD   Time 6   Period Weeks   Status Achieved   PT LONG TERM GOAL #5   Title improved RLE strength to 4+/5    Time 6   Period Weeks   Status Achieved  R knee ext 5/5, R knee flex 4+/5 MMT               Plan - 2014/11/27 1544    Clinical Impression Statement Patient has progressed well with PT and achieved all goals set at evaluation. AROM of R knee measured as 0-120 deg today in supine. R knee ext MMT 5/5, R knee flex 4+/5. Completed all therapeutic exercises well with only minimal mulitmodal cueing for correct exercise technqiue. Denied any modaliities today and was encouraged to contact clinic or doctor for any questions.   Pt will benefit from skilled therapeutic intervention in order to improve on the following deficits Abnormal gait;Pain;Impaired flexibility;Decreased strength;Increased edema   Rehab Potential Excellent   Clinical Impairments Affecting Rehab Potential Lt THA   PT Frequency 3x / week   PT Duration 6 weeks   PT Treatment/Interventions ADLs/Self Care Home Management;Electrical Stimulation;Therapeutic exercise;Stair training;Gait training;Neuromuscular re-education;Balance training;Patient/family education;Manual techniques;Vasopneumatic Device;Taping;Passive range of motion;Scar mobilization   PT Next Visit Plan Communicate need for D/C summary to MPT.   Consulted and Agree with Plan of Care Patient          G-Codes - 11/27/2014 1753    Functional Assessment Tool Used FOTO.   Functional Limitation Mobility: Walking and moving around   Mobility: Walking and Moving Around Current Status [phone]) At least 40 percent but less than 60 percent impaired, limited or restricted   Mobility: Walking and Moving Around Goal Status [phone]) At least 40 percent but less than 60 percent impaired, limited or restricted   Mobility: Walking and Moving Around Discharge  Status [phone]) At least 40 percent but less than 60 percent impaired, limited or restricted      Problem List Patient Active Problem List   Diagnosis Date Noted  . DJD (degenerative joint disease) of knee 09/24/2014  . COLONIC POLYPS, ADENOMATOUS, HX OF 10/07/2008  . HYPOTHYROIDISM 10/06/2008  .  HYPERCHOLESTEROLEMIA 10/06/2008  . DEPRESSION 10/06/2008  . HYPERTENSION 10/06/2008  . HEMORRHOIDS 10/06/2008  . DIVERTICULAR DISEASE 10/06/2008  . OSTEOPOROSIS 10/06/2008  . DYSPNEA 10/06/2008  . CHEST DISCOMFORT 10/06/2008  . COLONOSCOPY, HX OF 10/06/2008  . ARTHROSCOPY, RIGHT KNEE, HX OF 10/06/2008   PHYSICAL THERAPY DISCHARGE SUMMARY  Visits from Start of Care: 10  Current functional level related to goals / functional outcomes: All goals met.   Remaining deficits: None.   Education / Equipment: HEP. Plan: Patient agrees to discharge.  Patient goals were met. Patient is being discharged due to meeting the stated rehab goals.  ?????       Mischell Branford, [place] MPT 11/10/2014, 5:54 PM  [facility] [address] [place], [place], [address] Phone: [phone]   Fax:  [phone]  Name: [name] MRN: [id number] Date of Birth: August 13, 1933

## 2014-11-14 ENCOUNTER — Encounter: Payer: Medicare Other | Admitting: Physical Therapy

## 2015-02-06 DIAGNOSIS — H43813 Vitreous degeneration, bilateral: Secondary | ICD-10-CM | POA: Diagnosis not present

## 2015-02-06 DIAGNOSIS — H5203 Hypermetropia, bilateral: Secondary | ICD-10-CM | POA: Diagnosis not present

## 2015-02-06 DIAGNOSIS — H43393 Other vitreous opacities, bilateral: Secondary | ICD-10-CM | POA: Diagnosis not present

## 2015-02-06 DIAGNOSIS — Z961 Presence of intraocular lens: Secondary | ICD-10-CM | POA: Diagnosis not present

## 2015-03-10 DIAGNOSIS — R799 Abnormal finding of blood chemistry, unspecified: Secondary | ICD-10-CM | POA: Diagnosis not present

## 2015-03-10 DIAGNOSIS — E784 Other hyperlipidemia: Secondary | ICD-10-CM | POA: Diagnosis not present

## 2015-03-18 DIAGNOSIS — E784 Other hyperlipidemia: Secondary | ICD-10-CM | POA: Diagnosis not present

## 2015-03-18 DIAGNOSIS — E039 Hypothyroidism, unspecified: Secondary | ICD-10-CM | POA: Diagnosis not present

## 2015-09-15 DIAGNOSIS — R799 Abnormal finding of blood chemistry, unspecified: Secondary | ICD-10-CM | POA: Diagnosis not present

## 2015-09-15 DIAGNOSIS — E784 Other hyperlipidemia: Secondary | ICD-10-CM | POA: Diagnosis not present

## 2015-09-15 DIAGNOSIS — R5383 Other fatigue: Secondary | ICD-10-CM | POA: Diagnosis not present

## 2015-09-22 DIAGNOSIS — E039 Hypothyroidism, unspecified: Secondary | ICD-10-CM | POA: Diagnosis not present

## 2015-09-22 DIAGNOSIS — E785 Hyperlipidemia, unspecified: Secondary | ICD-10-CM | POA: Diagnosis not present

## 2015-09-28 DIAGNOSIS — Z0000 Encounter for general adult medical examination without abnormal findings: Secondary | ICD-10-CM | POA: Diagnosis not present

## 2015-10-01 DIAGNOSIS — Z1211 Encounter for screening for malignant neoplasm of colon: Secondary | ICD-10-CM | POA: Diagnosis not present

## 2016-01-07 DIAGNOSIS — J069 Acute upper respiratory infection, unspecified: Secondary | ICD-10-CM | POA: Diagnosis not present

## 2016-01-22 DIAGNOSIS — E784 Other hyperlipidemia: Secondary | ICD-10-CM | POA: Diagnosis not present

## 2016-01-22 DIAGNOSIS — R799 Abnormal finding of blood chemistry, unspecified: Secondary | ICD-10-CM | POA: Diagnosis not present

## 2016-01-25 DIAGNOSIS — I10 Essential (primary) hypertension: Secondary | ICD-10-CM | POA: Diagnosis not present

## 2016-01-25 DIAGNOSIS — E039 Hypothyroidism, unspecified: Secondary | ICD-10-CM | POA: Diagnosis not present

## 2016-01-25 DIAGNOSIS — E785 Hyperlipidemia, unspecified: Secondary | ICD-10-CM | POA: Diagnosis not present

## 2016-05-04 DIAGNOSIS — C44519 Basal cell carcinoma of skin of other part of trunk: Secondary | ICD-10-CM | POA: Diagnosis not present

## 2016-05-04 DIAGNOSIS — C44319 Basal cell carcinoma of skin of other parts of face: Secondary | ICD-10-CM | POA: Diagnosis not present

## 2016-05-04 DIAGNOSIS — D225 Melanocytic nevi of trunk: Secondary | ICD-10-CM | POA: Diagnosis not present

## 2016-05-04 DIAGNOSIS — C44619 Basal cell carcinoma of skin of left upper limb, including shoulder: Secondary | ICD-10-CM | POA: Diagnosis not present

## 2016-06-06 DIAGNOSIS — Z08 Encounter for follow-up examination after completed treatment for malignant neoplasm: Secondary | ICD-10-CM | POA: Diagnosis not present

## 2016-06-06 DIAGNOSIS — Z85828 Personal history of other malignant neoplasm of skin: Secondary | ICD-10-CM | POA: Diagnosis not present

## 2016-06-06 DIAGNOSIS — D485 Neoplasm of uncertain behavior of skin: Secondary | ICD-10-CM | POA: Diagnosis not present

## 2016-06-06 DIAGNOSIS — L910 Hypertrophic scar: Secondary | ICD-10-CM | POA: Diagnosis not present

## 2016-07-29 DIAGNOSIS — R799 Abnormal finding of blood chemistry, unspecified: Secondary | ICD-10-CM | POA: Diagnosis not present

## 2016-07-29 DIAGNOSIS — R5383 Other fatigue: Secondary | ICD-10-CM | POA: Diagnosis not present

## 2016-07-29 DIAGNOSIS — E785 Hyperlipidemia, unspecified: Secondary | ICD-10-CM | POA: Diagnosis not present

## 2016-08-02 DIAGNOSIS — E785 Hyperlipidemia, unspecified: Secondary | ICD-10-CM | POA: Diagnosis not present

## 2016-08-02 DIAGNOSIS — N289 Disorder of kidney and ureter, unspecified: Secondary | ICD-10-CM | POA: Diagnosis not present

## 2016-08-02 DIAGNOSIS — E039 Hypothyroidism, unspecified: Secondary | ICD-10-CM | POA: Diagnosis not present

## 2016-09-08 DIAGNOSIS — Z85828 Personal history of other malignant neoplasm of skin: Secondary | ICD-10-CM | POA: Diagnosis not present

## 2016-09-08 DIAGNOSIS — Z08 Encounter for follow-up examination after completed treatment for malignant neoplasm: Secondary | ICD-10-CM | POA: Diagnosis not present

## 2016-09-22 DIAGNOSIS — Z23 Encounter for immunization: Secondary | ICD-10-CM | POA: Diagnosis not present

## 2016-11-23 DIAGNOSIS — E785 Hyperlipidemia, unspecified: Secondary | ICD-10-CM | POA: Diagnosis not present

## 2016-11-23 DIAGNOSIS — I10 Essential (primary) hypertension: Secondary | ICD-10-CM | POA: Diagnosis not present

## 2016-12-01 DIAGNOSIS — J209 Acute bronchitis, unspecified: Secondary | ICD-10-CM | POA: Diagnosis not present

## 2016-12-01 DIAGNOSIS — J069 Acute upper respiratory infection, unspecified: Secondary | ICD-10-CM | POA: Diagnosis not present

## 2016-12-01 DIAGNOSIS — R05 Cough: Secondary | ICD-10-CM | POA: Diagnosis not present

## 2016-12-09 ENCOUNTER — Emergency Department (HOSPITAL_COMMUNITY)
Admission: EM | Admit: 2016-12-09 | Discharge: 2016-12-09 | Disposition: A | Discharge status: Home / Self Care | Payer: PPO | Attending: Emergency Medicine | Admitting: Emergency Medicine

## 2016-12-09 ENCOUNTER — Encounter (HOSPITAL_COMMUNITY): Payer: Self-pay | Admitting: Emergency Medicine

## 2016-12-09 ENCOUNTER — Emergency Department (HOSPITAL_COMMUNITY): Payer: PPO

## 2016-12-09 DIAGNOSIS — E039 Hypothyroidism, unspecified: Secondary | ICD-10-CM | POA: Insufficient documentation

## 2016-12-09 DIAGNOSIS — M6281 Muscle weakness (generalized): Secondary | ICD-10-CM | POA: Insufficient documentation

## 2016-12-09 DIAGNOSIS — I10 Essential (primary) hypertension: Secondary | ICD-10-CM | POA: Diagnosis not present

## 2016-12-09 DIAGNOSIS — R531 Weakness: Secondary | ICD-10-CM | POA: Diagnosis present

## 2016-12-09 DIAGNOSIS — Z79899 Other long term (current) drug therapy: Secondary | ICD-10-CM | POA: Insufficient documentation

## 2016-12-09 DIAGNOSIS — Z7982 Long term (current) use of aspirin: Secondary | ICD-10-CM | POA: Diagnosis not present

## 2016-12-09 DIAGNOSIS — R05 Cough: Secondary | ICD-10-CM | POA: Diagnosis not present

## 2016-12-09 LAB — URINALYSIS, ROUTINE W REFLEX MICROSCOPIC
BACTERIA UA: NONE SEEN
Bilirubin Urine: NEGATIVE
Glucose, UA: NEGATIVE mg/dL
KETONES UR: NEGATIVE mg/dL
LEUKOCYTES UA: NEGATIVE
Nitrite: NEGATIVE
PROTEIN: NEGATIVE mg/dL
Specific Gravity, Urine: 1.005 (ref 1.005–1.030)
Squamous Epithelial / LPF: NONE SEEN
pH: 8 (ref 5.0–8.0)

## 2016-12-09 LAB — HEPATIC FUNCTION PANEL
ALT: 32 U/L (ref 14–54)
AST: 34 U/L (ref 15–41)
Albumin: 4.1 g/dL (ref 3.5–5.0)
Alkaline Phosphatase: 41 U/L (ref 38–126)
BILIRUBIN DIRECT: 0.1 mg/dL (ref 0.1–0.5)
BILIRUBIN INDIRECT: 0.9 mg/dL (ref 0.3–0.9)
BILIRUBIN TOTAL: 1 mg/dL (ref 0.3–1.2)
Total Protein: 6.8 g/dL (ref 6.5–8.1)

## 2016-12-09 LAB — BASIC METABOLIC PANEL
Anion gap: 9 (ref 5–15)
BUN: 15 mg/dL (ref 6–20)
CO2: 24 mmol/L (ref 22–32)
Calcium: 9.5 mg/dL (ref 8.9–10.3)
Chloride: 104 mmol/L (ref 101–111)
Creatinine, Ser: 1.13 mg/dL — ABNORMAL HIGH (ref 0.44–1.00)
GFR calc non Af Amer: 44 mL/min — ABNORMAL LOW (ref >60)
GFR, EST AFRICAN AMERICAN: 51 mL/min — AB (ref >60)
Glucose, Bld: 117 mg/dL — ABNORMAL HIGH (ref 65–99)
POTASSIUM: 3.7 mmol/L (ref 3.5–5.1)
SODIUM: 137 mmol/L (ref 135–145)

## 2016-12-09 LAB — CBG MONITORING, ED: GLUCOSE-CAPILLARY: 112 mg/dL — AB (ref 65–99)

## 2016-12-09 LAB — CBC
HEMATOCRIT: 40.7 % (ref 36.0–46.0)
Hemoglobin: 13.5 g/dL (ref 12.0–15.0)
MCH: 31.9 pg (ref 26.0–34.0)
MCHC: 33.2 g/dL (ref 30.0–36.0)
MCV: 96.2 fL (ref 78.0–100.0)
Platelets: 164 10*3/uL (ref 150–400)
RBC: 4.23 MIL/uL (ref 3.87–5.11)
RDW: 12.8 % (ref 11.5–15.5)
WBC: 6.2 10*3/uL (ref 4.0–10.5)

## 2016-12-09 LAB — TROPONIN I: Troponin I: <0.03 ng/mL (ref <0.03)

## 2016-12-09 MED ORDER — SODIUM CHLORIDE 0.9 % IV BOLUS (SEPSIS)
500.0000 mL | Freq: Once | INTRAVENOUS | Status: AC
  Administered 2016-12-09: 500 mL via INTRAVENOUS

## 2016-12-09 NOTE — ED Notes (Signed)
Pt transported to xray 

## 2016-12-09 NOTE — Discharge Instructions (Signed)
Your blood work shows mild dehydration.  Drink fluids and eat well.  Return without fail for worsening symptoms, including fever, confusion, extreme fatigue, difficulty walking or any other symptoms concerning to you.

## 2016-12-09 NOTE — ED Provider Notes (Signed)
[facility] EMERGENCY DEPARTMENT Provider Note   CSN: [id number] Arrival date & time: 12/09/16  2440     History   Chief Complaint Chief Complaint  Patient presents with  . Weakness    HPI [name] is a 83 y.o. female.  The history is provided by the patient.  Weakness  Primary symptoms include no focal weakness, no loss of sensation, no visual change, no auditory change, and no dizziness. This is a new problem. The current episode started more than 2 days ago. The problem has not changed since onset.There was no focality noted. There has been no fever. Pertinent negatives include no shortness of breath, no chest pain, no vomiting, no altered mental status, no confusion and no headaches. Associated medical issues do not include trauma.   83 year old female who presents with generalized weakness for 1 week.  History of hypertension hyperlipidemia.  States that she has had some mildly decreased appetite and mild congestion.  No fevers, chills, chest pain, difficulty breathing, cough, abdominal pain, vomiting, nausea, diarrhea, dysuria, urinary frequency, headaches, vision or speech changes, confusion, focal numbness or weakness.  Occasionally feels a little unsteady with ambulation.   Past Medical History:  Diagnosis Date  . Anemia    hx -2013 blood transfusion  polyps  . Arthritis   . Hyperlipemia   . Hypertension   . Hypothyroidism   . Nocturia     Patient Active Problem List   Diagnosis Date Noted  . DJD (degenerative joint disease) of knee 09/24/2014  . COLONIC POLYPS, ADENOMATOUS, HX OF 10/07/2008  . HYPOTHYROIDISM 10/06/2008  . HYPERCHOLESTEROLEMIA 10/06/2008  . DEPRESSION 10/06/2008  . HYPERTENSION 10/06/2008  . HEMORRHOIDS 10/06/2008  . DIVERTICULAR DISEASE 10/06/2008  . OSTEOPOROSIS 10/06/2008  . DYSPNEA 10/06/2008  . CHEST DISCOMFORT 10/06/2008  . COLONOSCOPY, HX OF 10/06/2008  . ARTHROSCOPY, RIGHT KNEE, HX OF 10/06/2008    Past Surgical  History:  Procedure Laterality Date  . FRACTURE SURGERY     left leg  rod  . JOINT REPLACEMENT Left    hip  . KNEE ARTHROSCOPY Right   . LUMBAR LAMINECTOMY/DECOMPRESSION MICRODISCECTOMY Right 04/10/2012   Procedure: LUMBAR LAMINECTOMY/DECOMPRESSION MICRODISCECTOMY 1 LEVEL;  Surgeon: Erline Levine, MD;  Location: [facility];  Service: Neurosurgery;  Laterality: Right;  Right Lumbar three - four Microdiskectomy  . TOTAL KNEE ARTHROPLASTY Right 09/24/2014   Procedure: TOTAL KNEE ARTHROPLASTY;  Surgeon: Ninetta Lights, MD;  Location: [facility];  Service: Orthopedics;  Laterality: Right;    OB History    No data available       Home Medications    Prior to Admission medications   Medication Sig Start Date End Date Taking? Authorizing Provider  aspirin EC 81 MG tablet Take 81 mg by mouth daily.   Yes [provider]  docusate sodium (PHILLIPS STOOL SOFTENER) 100 MG capsule Take 100 mg by mouth 2 (two) times daily.   Yes [provider]  hydrochlorothiazide (HYDRODIURIL) 25 MG tablet Take 25 mg by mouth daily.   Yes [provider]  levothyroxine (SYNTHROID, LEVOTHROID) 125 MCG tablet Take 125 mcg by mouth daily.   Yes [provider]  Multiple Vitamin (MULTIVITAMIN WITH MINERALS) TABS tablet Take 1 tablet by mouth daily.   Yes [provider]  Omega-3 Fatty Acids (FISH OIL) 1000 MG CAPS Take 1 capsule by mouth daily.   Yes [provider]  rosuvastatin (CRESTOR) 10 MG tablet Take 10 mg by mouth daily.   Yes  [provider]  apixaban (ELIQUIS) 2.5 MG TABS tablet Take 1 tab q 12 hours x 14 days following surgery to prevent blood clots Patient not taking: Reported on 10/13/2014 09/24/14   Aundra Dubin, PA-C  bisacodyl (DULCOLAX) 5 MG EC tablet Take 1 tablet (5 mg total) by mouth daily as needed for moderate constipation. Patient not taking: Reported on 10/13/2014 09/24/14   Aundra Dubin, PA-C  HYDROcodone-acetaminophen [facility])  7.5-325 MG per tablet Take 1-2 tablets by mouth every 4 (four) hours as needed for moderate pain. Patient not taking: Reported on 12/09/2016 09/24/14   Aundra Dubin, PA-C  ondansetron (ZOFRAN) 4 MG tablet Take 1 tablet (4 mg total) by mouth every 8 (eight) hours as needed for nausea or vomiting. Patient not taking: Reported on 10/13/2014 09/24/14   Nathaniel Man    Family History History reviewed. No pertinent family history.  Social History Social History   Tobacco Use  . Smoking status: Never Smoker  . Smokeless tobacco: Never Used  Substance Use Topics  . Alcohol use: No  . Drug use: No     Allergies   Patient has no known allergies.   Review of Systems Review of Systems  Constitutional: Negative for fever.  Respiratory: Negative for shortness of breath.   Cardiovascular: Negative for chest pain.  Gastrointestinal: Negative for vomiting.  Neurological: Positive for weakness. Negative for dizziness, focal weakness and headaches.  Psychiatric/Behavioral: Negative for confusion.  All other systems reviewed and are negative.    Physical Exam Updated Vital Signs BP (!) 198/84   Pulse (!) 55   Temp 97.8 F (36.6 C) (Oral)   Resp 13   Ht 5\' 8"  (1.727 m)   Wt 76.2 kg (168 lb)   SpO2 100%   BMI 25.54 kg/m   Physical Exam Physical Exam  Nursing note and vitals reviewed. Constitutional: Well developed, well nourished, non-toxic, and in no acute distress Head: Normocephalic and atraumatic.  Mouth/Throat: Oropharynx is clear and dry mucous membranes.  Neck: Normal range of motion. Neck supple.  Cardiovascular: Normal rate and regular rhythm.   Pulmonary/Chest: Effort normal and breath sounds normal.  Abdominal: Soft. There is no tenderness. There is no rebound and no guarding.  Musculoskeletal: Normal range of motion.  Skin: Skin is warm and dry.  Psychiatric: Cooperative Neurological:  Alert, oriented to person, place, time, and situation. Memory grossly  in tact. Fluent speech. No dysarthria or aphasia.  Cranial nerves: VF are full.  EOMI without nystagmus. No gaze deviation. Facial muscles symmetric with activation. Sensation to light touch over face in tact bilaterally. Hearing grossly in tact. Palate elevates symmetrically. Head turn and shoulder shrug are intact. Tongue midline.  Reflexes defered.  Muscle bulk and tone normal. No pronator drift. Moves all extremities symmetrically. Sensation to light touch is in tact throughout in bilateral upper and lower extremities. Coordination reveals no dysmetria with finger to nose. Gait is narrow-based and steady. Non-ataxic.    ED Treatments / Results  Labs (all labs ordered are listed, but only abnormal results are displayed) Labs Reviewed  BASIC METABOLIC PANEL - Abnormal; Notable for the following components:      Result Value   Glucose, Bld 117 (*)    Creatinine, Ser 1.13 (*)    GFR calc non Af Amer 44 (*)    GFR calc Af Amer 51 (*)    All other components within normal limits  URINALYSIS, ROUTINE W REFLEX MICROSCOPIC - Abnormal; Notable for the  following components:   Color, Urine STRAW (*)    Hgb urine dipstick SMALL (*)    All other components within normal limits  CBG MONITORING, ED - Abnormal; Notable for the following components:   Glucose-Capillary 112 (*)    All other components within normal limits  CBC  TROPONIN I  HEPATIC FUNCTION PANEL    EKG  EKG Interpretation  Date/Time:  Friday December 09 2016 10:28:46 EST Ventricular Rate:  69 PR Interval:    QRS Duration: 93 QT Interval:  392 QTC Calculation: 420 R Axis:   68 Text Interpretation:  Sinus rhythm no acute changes  Confirmed by Brantley Stage [phone]) on 12/09/2016 11:09:49 AM       Radiology Dg Chest 2 View  Result Date: 12/09/2016 CLINICAL DATA:  Weakness and cough EXAM: CHEST  2 VIEW COMPARISON:  04/05/2012 FINDINGS: Cardiac shadow is within normal limits. Mild aortic calcifications are seen. The lungs  are mildly hyperaerated bilaterally. No focal infiltrate or sizable effusion is seen. No acute bony abnormality is seen. IMPRESSION: COPD without acute abnormality. Electronically Signed   By: Inez Catalina M.D.   On: 12/09/2016 12:03    Procedures Procedures (including critical care time)  Medications Ordered in ED Medications  sodium chloride 0.9 % bolus 500 mL (0 mLs Intravenous Stopped 12/09/16 1301)     Initial Impression / Assessment and Plan / ED Course  I have reviewed the triage vital signs and the nursing notes.  Pertinent labs & imaging results that were available during my care of the patient were reviewed by me and considered in my medical decision making (see chart for details).     83 year old female who presents with generalized weakness.  She is nontoxic in no acute distress.  Is mildly hypertensive here in the ED.  Has a nonfocal neurological exam.  Does look mildly dry, but otherwise nonfocal physical exam.  Her workup overall is reassuring.  No evidence of infection on her chest x-ray or urine.  Blood work reveals minimal creatinine elevation that could be suggestive of mild dehydration.  Is given some IV fluids and she does feel improved.  She ambulates with steady gait here in the ED.  No other major electrolyte or metabolic derangements.  EKG is nonischemic, and troponin is normal.  Do not suspect atypical ACS presentation.  She is felt to be stable for discharge home. Discussed increased fluid intake at home. Strict return and follow-up instructions reviewed. She expressed understanding of all discharge instructions and felt comfortable with the plan of care.    Final Clinical Impressions(s) / ED Diagnoses   Final diagnoses:  Generalized weakness    ED Discharge Orders    None       Forde Dandy, MD 12/09/16 1408

## 2016-12-09 NOTE — ED Triage Notes (Signed)
Patient complaining of weakness x 1 week. States she saw PCP last week for congestion and weakness.

## 2016-12-28 DIAGNOSIS — Z0000 Encounter for general adult medical examination without abnormal findings: Secondary | ICD-10-CM | POA: Diagnosis not present

## 2017-01-20 DIAGNOSIS — H2513 Age-related nuclear cataract, bilateral: Secondary | ICD-10-CM | POA: Diagnosis not present

## 2017-01-20 DIAGNOSIS — H40033 Anatomical narrow angle, bilateral: Secondary | ICD-10-CM | POA: Diagnosis not present

## 2017-03-22 DIAGNOSIS — Z1231 Encounter for screening mammogram for malignant neoplasm of breast: Secondary | ICD-10-CM | POA: Diagnosis not present

## 2017-05-04 DIAGNOSIS — Z85828 Personal history of other malignant neoplasm of skin: Secondary | ICD-10-CM | POA: Diagnosis not present

## 2017-05-04 DIAGNOSIS — D2239 Melanocytic nevi of other parts of face: Secondary | ICD-10-CM | POA: Diagnosis not present

## 2017-05-04 DIAGNOSIS — Z08 Encounter for follow-up examination after completed treatment for malignant neoplasm: Secondary | ICD-10-CM | POA: Diagnosis not present

## 2017-05-04 DIAGNOSIS — D224 Melanocytic nevi of scalp and neck: Secondary | ICD-10-CM | POA: Diagnosis not present

## 2017-05-29 DIAGNOSIS — R799 Abnormal finding of blood chemistry, unspecified: Secondary | ICD-10-CM | POA: Diagnosis not present

## 2017-05-29 DIAGNOSIS — E785 Hyperlipidemia, unspecified: Secondary | ICD-10-CM | POA: Diagnosis not present

## 2017-05-31 DIAGNOSIS — M1990 Unspecified osteoarthritis, unspecified site: Secondary | ICD-10-CM | POA: Diagnosis not present

## 2017-05-31 DIAGNOSIS — E785 Hyperlipidemia, unspecified: Secondary | ICD-10-CM | POA: Diagnosis not present

## 2017-05-31 DIAGNOSIS — E039 Hypothyroidism, unspecified: Secondary | ICD-10-CM | POA: Diagnosis not present

## 2017-05-31 DIAGNOSIS — I10 Essential (primary) hypertension: Secondary | ICD-10-CM | POA: Diagnosis not present

## 2017-11-08 DIAGNOSIS — J029 Acute pharyngitis, unspecified: Secondary | ICD-10-CM | POA: Diagnosis not present

## 2017-11-08 DIAGNOSIS — R509 Fever, unspecified: Secondary | ICD-10-CM | POA: Diagnosis not present

## 2017-11-16 DIAGNOSIS — E039 Hypothyroidism, unspecified: Secondary | ICD-10-CM | POA: Diagnosis not present

## 2017-11-16 DIAGNOSIS — I10 Essential (primary) hypertension: Secondary | ICD-10-CM | POA: Diagnosis not present

## 2017-11-16 DIAGNOSIS — E785 Hyperlipidemia, unspecified: Secondary | ICD-10-CM | POA: Diagnosis not present

## 2017-12-04 DIAGNOSIS — E785 Hyperlipidemia, unspecified: Secondary | ICD-10-CM | POA: Diagnosis not present

## 2017-12-04 DIAGNOSIS — E039 Hypothyroidism, unspecified: Secondary | ICD-10-CM | POA: Diagnosis not present

## 2017-12-04 DIAGNOSIS — I10 Essential (primary) hypertension: Secondary | ICD-10-CM | POA: Diagnosis not present

## 2018-01-15 DIAGNOSIS — I10 Essential (primary) hypertension: Secondary | ICD-10-CM | POA: Diagnosis not present

## 2018-10-03 ENCOUNTER — Telehealth: Payer: Self-pay

## 2018-10-03 ENCOUNTER — Telehealth: Payer: Self-pay | Admitting: *Deleted

## 2018-10-03 NOTE — Telephone Encounter (Signed)
   [facility] Pre-operative Risk Assessment    Request for surgical clearance:  1. What type of surgery is being performed? RIGHT TOTAL HIP REPLACEMENT    2. When is this surgery scheduled? TBD   3. What type of clearance is required (medical clearance vs. Pharmacy clearance to hold med vs. Both)? BOTH  4. Are there any medications that need to be held prior to surgery and how long? ELIQUIS AND ASA   5. Practice name and name of physician performing surgery? [facility]; DR. Percell Miller   6. What is your office phone number [phone] EXT [phone]    7.   What is your office fax number [phone]  8.   Anesthesia type (None, local, MAC, general) ? CHOICE   [name] 10/03/2018, 3:31 PM  _________________________________________________________________   (provider comments below)

## 2018-10-03 NOTE — Telephone Encounter (Signed)
NOTES ON FILE FROM [facility] [phone], SENT REFERRAL TO SCHEDULING 

## 2018-10-03 NOTE — Telephone Encounter (Signed)
Last Eliquis Rx was postprocedural prevention in 2016 ([place] not taking per medlist notes) .   I called patient and clarified she is NOT on any type of anticoagulation other than ASA 81mg   NOT follow by our cardiologist either. New Patient appointment scheduled with DR Tamala Julian for 12/05/2018.  MD to address Aspirin.

## 2018-10-22 ENCOUNTER — Other Ambulatory Visit (HOSPITAL_COMMUNITY): Payer: PPO

## 2018-10-30 ENCOUNTER — Ambulatory Visit: Admit: 2018-10-30 | Payer: PPO | Admitting: Orthopedic Surgery

## 2018-10-30 SURGERY — ARTHROPLASTY, HIP, TOTAL, ANTERIOR APPROACH
Anesthesia: Choice | Site: Hip | Laterality: Right

## 2018-12-04 NOTE — Progress Notes (Signed)
Cardiology Office Note:    Date:  12/05/2018   ID:  [name], DOB [birth year], MRN [id number]  PCP:  Chesley Noon, MD  Cardiologist:  No primary care provider on file.   Referring MD: Dione Housekeeper, MD   Chief Complaint  Patient presents with  . Advice Only    Preoperative clearance    History of Present Illness:    [name] is a 85 y.o. female with a hx of hyperlipidemia, hypertension, osteoarthritis, who is referred for preop cardiovascular clearance prior to upcoming surgery.  There are no cardiac complaints.  The patient did feel mild palpitations during her visit with primary care.  She is limited by right hip discomfort.  She is able to participate in her usual activities at home taking care not to fall due to the hip.  She specifically denies chest discomfort, dyspnea, orthopnea, lower extremity swelling, PND, and syncope.  She has no history of vascular disease.  Her cardiovascular risk profile is low.  She is not diabetic, does not have hypertension, never smoked, does have history of mild elevation in lipids.  The most recent total cholesterol was 165 with an LDL of 80 in November 2020  Past Medical History:  Diagnosis Date  . Anemia    hx -2013 blood transfusion  polyps  . Arthritis   . Hyperlipemia   . Hypertension   . Hypothyroidism   . Nocturia     Past Surgical History:  Procedure Laterality Date  . FRACTURE SURGERY     left leg  rod  . JOINT REPLACEMENT Left    hip  . KNEE ARTHROSCOPY Right   . LUMBAR LAMINECTOMY/DECOMPRESSION MICRODISCECTOMY Right 04/10/2012   Procedure: LUMBAR LAMINECTOMY/DECOMPRESSION MICRODISCECTOMY 1 LEVEL;  Surgeon: Erline Levine, MD;  Location: [facility];  Service: Neurosurgery;  Laterality: Right;  Right Lumbar three - four Microdiskectomy  . TOTAL KNEE ARTHROPLASTY Right 09/24/2014   Procedure: TOTAL KNEE ARTHROPLASTY;  Surgeon: Ninetta Lights, MD;  Location: [facility];  Service: Orthopedics;   Laterality: Right;    Current Medications: Current Meds  Medication Sig  . aspirin EC 81 MG tablet Take 81 mg by mouth daily.  . Calcium Carbonate-Vitamin D (CALTRATE 600+D PO) Take 1 tablet by mouth daily.  . cholecalciferol (VITAMIN D) 25 MCG (1000 UT) tablet Take 1,000 Units by mouth daily.  Marland Kitchen docusate sodium (PHILLIPS STOOL SOFTENER) 100 MG capsule Take 100 mg by mouth 2 (two) times daily.  . hydrochlorothiazide (HYDRODIURIL) 25 MG tablet Take 25 mg by mouth daily.  Marland Kitchen HYDROcodone-acetaminophen (NORCO) 7.5-325 MG per tablet Take 1-2 tablets by mouth every 4 (four) hours as needed for moderate pain.  . hydrOXYzine (ATARAX/VISTARIL) 10 MG tablet Take 10 mg by mouth at bedtime as needed.  Marland Kitchen levothyroxine (SYNTHROID, LEVOTHROID) 125 MCG tablet Take 125 mcg by mouth daily.  . Multiple Vitamin (MULTIVITAMIN WITH MINERALS) TABS tablet Take 1 tablet by mouth daily.  . Omega-3 Fatty Acids (FISH OIL) 1000 MG CAPS Take 1 capsule by mouth daily.  . rosuvastatin (CRESTOR) 10 MG tablet Take 10 mg by mouth daily.     Allergies:   Patient has no known allergies.   Social History   Socioeconomic History  . Marital status: Married    Spouse name: Not on file  . Number of children: Not on file  . Years of education: Not on file  . Highest education level: Not on file  Occupational History  . Not on file  Social  Needs  . Financial resource strain: Not on file  . Food insecurity    Worry: Not on file    Inability: Not on file  . Transportation needs    Medical: Not on file    Non-medical: Not on file  Tobacco Use  . Smoking status: Never Smoker  . Smokeless tobacco: Never Used  Substance and Sexual Activity  . Alcohol use: No  . Drug use: No  . Sexual activity: Not on file  Lifestyle  . Physical activity    Days per week: Not on file    Minutes per session: Not on file  . Stress: Not on file  Relationships  . Social Herbalist on phone: Not on file    Gets together: Not  on file    Attends religious service: Not on file    Active member of club or organization: Not on file    Attends meetings of clubs or organizations: Not on file    Relationship status: Not on file  Other Topics Concern  . Not on file  Social History Narrative  . Not on file     Family History: The patient's family history includes Stroke in her mother.  ROS:   Please see the history of present illness.    Multiple articular complaints including prior left hip and bilateral knee replacement surgeries.  No surgical issues with those.  Prior left lower extremity fracture repair 2002.  The problem list from primary care does point out hyperlipidemia and essential hypertension as current medical problems.  The EKG performed during the recent office visit demonstrated.  She had nonspecific ST abnormality.  She was asymptomatic other than feeling palpitation.  All other systems reviewed and are negative.  EKGs/Labs/Other Studies Reviewed:    The following studies were reviewed today: No cardiac functional or imaging data  EKG:  EKG demonstrates normal sinus rhythm with nonspecific diffuse ST abnormality.  No evidence of infarction.  When compared to 2018 the ST abnormality is new.  When compared to the an office electrocardiogram performed in September 2020 PVCs are no longer present.  Recent Labs: No results found for requested labs within last 8760 hours.  Recent Lipid Panel No results found for: CHOL, TRIG, HDL, CHOLHDL, VLDL, LDLCALC, LDLDIRECT  Physical Exam:    VS:  BP (!) 148/88   Pulse 78   Ht 5\' 8"  (1.727 m)   Wt 169 lb 6.4 oz (76.8 kg)   SpO2 97%   BMI 25.76 kg/m     Wt Readings from Last 3 Encounters:  12/05/18 169 lb 6.4 oz (76.8 kg)  12/09/16 168 lb (76.2 kg)  09/24/14 168 lb 10 oz (76.5 kg)     GEN: Compatible with age. No acute distress HEENT: Normal NECK: No JVD. LYMPHATICS: No lymphadenopathy CARDIAC: Scratchy left parasternal 1/6 systolic murmur.  RRR  without murmur, gallop, or edema. VASCULAR:  Normal Pulses. No bruits. RESPIRATORY:  Clear to auscultation without rales, wheezing or rhonchi  ABDOMEN: Soft, non-tender, non-distended, No pulsatile mass, MUSCULOSKELETAL: No deformity  SKIN: Warm and dry NEUROLOGIC:  Alert and oriented x 3 PSYCHIATRIC:  Normal affect   ASSESSMENT:    1. Preoperative cardiovascular examination   2. Essential hypertension   3. HYPERCHOLESTEROLEMIA   4. Premature ventricular contractions   5. Nonspecific abnormal electrocardiogram (ECG) (EKG)   6. Educated about COVID-19 virus infection    PLAN:    In order of problems listed above:  1. The cardiovascular stress  associated with hip replacement surgery is low.  The patient has no heart failure or ischemic symptoms currently.  There is no contraindication to proceeding with right hip surgery.  Anticipated risk is low (less than 1% for cardiovascular complications). 2. Blood pressure control is currently adequate given age although target should be 140/80 mmHg or less. 3. Lipids are reasonable given her age.  Given absence of other risk factors. 4. Abnormal resting EKG with minimal ST abnormality of uncertain significance.  If she develops any symptoms consistent with angina would consider a coronary CT angiogram. 5. PVCs noted on prior EKG 2 months ago.  May need monitoring if palpitations become a complaint.  For now watchful waiting. 6. The 3W's are being practiced.  Cleared for upcoming right hip surgery by Dr. Percell Miller.   Medication Adjustments/Labs and Tests Ordered: Current medicines are reviewed at length with the patient today.  Concerns regarding medicines are outlined above.  Orders Placed This Encounter  Procedures  . EKG 12-Lead   No orders of the defined types were placed in this encounter.   Patient Instructions  Medication Instructions:  Your physician recommends that you continue on your current medications as directed. Please refer  to the Current Medication list given to you today.  *If you need a refill on your cardiac medications before your next appointment, please call your pharmacy*  Lab Work: None If you have labs (blood work) drawn today and your tests are completely normal, you will receive your results only by: Marland Kitchen MyChart Message (if you have MyChart) OR . A paper copy in the mail If you have any lab test that is abnormal or we need to change your treatment, we will call you to review the results.  Testing/Procedures: None  Follow-Up: At [facility], you and your health needs are our priority.  As part of our continuing mission to provide you with exceptional heart care, we have created designated Provider Care Teams.  These Care Teams include your primary Cardiologist (physician) and Advanced Practice Providers (APPs -  Physician Assistants and Nurse Practitioners) who all work together to provide you with the care you need, when you need it.  Your next appointment:   As needed   The format for your next appointment:   In Person  Provider:   You may see Dr. Daneen Schick or one of the following Advanced Practice Providers on your designated Care Team:    Truitt Merle, NP  Cecilie Kicks, NP  Kathyrn Drown, NP   Other Instructions      Signed, Sinclair Grooms, MD  12/05/2018 10:30 AM    [place]

## 2018-12-05 ENCOUNTER — Encounter: Payer: Self-pay | Admitting: Interventional Cardiology

## 2018-12-05 ENCOUNTER — Ambulatory Visit (INDEPENDENT_AMBULATORY_CARE_PROVIDER_SITE_OTHER): Payer: Medicare Other | Admitting: Interventional Cardiology

## 2018-12-05 ENCOUNTER — Other Ambulatory Visit: Payer: Self-pay

## 2018-12-05 VITALS — BP 148/88 | HR 78 | Ht 68.0 in | Wt 169.4 lb

## 2018-12-05 DIAGNOSIS — I10 Essential (primary) hypertension: Secondary | ICD-10-CM

## 2018-12-05 DIAGNOSIS — R9431 Abnormal electrocardiogram [ECG] [EKG]: Secondary | ICD-10-CM

## 2018-12-05 DIAGNOSIS — Z7189 Other specified counseling: Secondary | ICD-10-CM

## 2018-12-05 DIAGNOSIS — E7800 Pure hypercholesterolemia, unspecified: Secondary | ICD-10-CM | POA: Diagnosis not present

## 2018-12-05 DIAGNOSIS — I493 Ventricular premature depolarization: Secondary | ICD-10-CM

## 2018-12-05 DIAGNOSIS — Z01810 Encounter for preprocedural cardiovascular examination: Secondary | ICD-10-CM | POA: Diagnosis not present

## 2018-12-05 NOTE — Patient Instructions (Signed)
Medication Instructions:  Your physician recommends that you continue on your current medications as directed. Please refer to the Current Medication list given to you today.  *If you need a refill on your cardiac medications before your next appointment, please call your pharmacy*  Lab Work: None If you have labs (blood work) drawn today and your tests are completely normal, you will receive your results only by: . MyChart Message (if you have MyChart) OR . A paper copy in the mail If you have any lab test that is abnormal or we need to change your treatment, we will call you to review the results.  Testing/Procedures: None  Follow-Up: At [facility], you and your health needs are our priority.  As part of our continuing mission to provide you with exceptional heart care, we have created designated Provider Care Teams.  These Care Teams include your primary Cardiologist (physician) and Advanced Practice Providers (APPs -  Physician Assistants and Nurse Practitioners) who all work together to provide you with the care you need, when you need it.  Your next appointment:   As needed   The format for your next appointment:   In Person  Provider:   You may see Dr. Henry Smith or one of the following Advanced Practice Providers on your designated Care Team:    Lori Gerhardt, NP  Laura Ingold, NP  Jill McDaniel, NP   Other Instructions   

## 2018-12-19 DIAGNOSIS — M1611 Unilateral primary osteoarthritis, right hip: Secondary | ICD-10-CM | POA: Diagnosis present

## 2018-12-19 NOTE — H&P (Signed)
HIP ARTHROPLASTY ADMISSION H&P  Patient ID: [name] MRN: [id number] DOB/AGE: 03-06-1933 85 y.o.  Chief Complaint: right hip pain.  Planned Procedure Date: 01/01/19 Medical Clearance by Fonnie Jarvis, NP Cardiac Clearance by Dr. Debara Pickett   HPI: [name] is a 85 y.o. female with a history of HTN, HLD, left THA, right TKA, hypothyroidism who presents for evaluation of OA RIGHT HIP. The patient has a history of pain and functional disability in the right hip due to arthritis and has failed non-surgical conservative treatments for greater than 12 weeks to include NSAID's and/or analgesics, corticosteriod injections and activity modification.  Onset of symptoms was gradual, starting 1 years ago with gradually worsening course since that time.  Patient currently rates pain at 8 out of 10 with activity. Patient has night pain, worsening of pain with activity and weight bearing and pain that interferes with activities of daily living.  Patient has evidence of subchondral cysts, subchondral sclerosis, periarticular osteophytes and joint space narrowing by imaging studies.  There is no active infection.  Past Medical History:  Diagnosis Date  . Anemia    hx -2013 blood transfusion  polyps  . Arthritis   . Hyperlipemia   . Hypertension   . Hypothyroidism   . Nocturia    Past Surgical History:  Procedure Laterality Date  . FRACTURE SURGERY     left leg  rod  . JOINT REPLACEMENT Left    hip  . KNEE ARTHROSCOPY Right   . LUMBAR LAMINECTOMY/DECOMPRESSION MICRODISCECTOMY Right 04/10/2012   Procedure: LUMBAR LAMINECTOMY/DECOMPRESSION MICRODISCECTOMY 1 LEVEL;  Surgeon: Erline Levine, MD;  Location: [facility];  Service: Neurosurgery;  Laterality: Right;  Right Lumbar three - four Microdiskectomy  . TOTAL KNEE ARTHROPLASTY Right 09/24/2014   Procedure: TOTAL KNEE ARTHROPLASTY;  Surgeon: Ninetta Lights, MD;  Location: [facility];  Service: Orthopedics;  Laterality: Right;   No  Known Allergies   Medications: Aspirin 81 mg daily Caltrate 600 plus vitamin D daily Fish oil 1200 mg daily Levothyroxine 125 mcg daily Multivitamin daily Phillips stool softener 100 mg twice daily Rosuvastatin 10 mg daily Vitamin D3 25 mcg daily  Social history: Married retired non-smoker.  No alcohol use.  Family history: Mother with MI, DM, CVA.  Sister with cancer.  ROS: Currently denies lightheadedness, dizziness, Fever, chills, CP, SOB.   No personal history of DVT, PE, MI, or CVA. No loose teeth.  Partial.  Wears glasses.  Some easy bruising and ankle swelling. All other systems have been reviewed and were otherwise currently negative with the exception of those mentioned in the HPI and as above.  Objective: Vitals: Ht: 5 feet 7 inches wt: 166 temp: 97.9 BP: 177/88 pulse: 78 O2 92% on room air.   Physical Exam: General: Alert, NAD. Trendelenberg Gait.  Uses a cane or walker. HEENT: EOMI, Good Neck Extension  Pulm: No increased work of breathing.  Clear B/L A/P w/o crackle or wheeze.  CV: RRR, No m/g/r appreciated  GI: soft, NT, ND Neuro: Neuro without gross focal deficit.  Sensation intact distally Skin: No lesions in the area of chief complaint MSK/Surgical Site: RIGHT Hip pain with passive ROM.  Positive Stinchfield.  5/5 strength.  NVI.    Imaging Review Plain radiographs demonstrate severe degenerative joint disease of the right hip.   Preoperative templating of the joint replacement has been completed, documented, and submitted to the Operating Room personnel in order to optimize intra-operative equipment management.  Assessment: OA RIGHT HIP Principal Problem:  Primary osteoarthritis of right hip Active Problems:   Hypothyroidism   HYPERCHOLESTEROLEMIA   Essential hypertension   Plan: Plan for Procedure(s): TOTAL HIP ARTHROPLASTY ANTERIOR APPROACH  The patient history, physical exam, clinical judgement of the provider and imaging are consistent with  end stage degenerative joint disease and total joint arthroplasty is deemed medically necessary. The treatment options including medical management, injection therapy, and arthroplasty were discussed at length. The risks and benefits of Procedure(s): TOTAL HIP ARTHROPLASTY ANTERIOR APPROACH were presented and reviewed.  The risks of nonoperative treatment, versus surgical intervention including but not limited to continued pain, aseptic loosening, stiffness, dislocation/subluxation, infection, bleeding, nerve injury, blood clots, cardiopulmonary complications, morbidity, mortality, among others were discussed. The patient verbalizes understanding and wishes to proceed with the plan.  Patient is being admitted for surgery, pain control, PT, prophylactic antibiotics, VTE prophylaxis, progressive ambulation, ADL's and discharge planning.   Dental prophylaxis discussed and recommended for 2 years postoperatively.   The patient does meet the criteria for TXA which will be used perioperatively.    ASA 81 mg BID will be used postoperatively for DVT prophylaxis in addition to SCDs, and early ambulation.  Plan for Norco, 100 mg Celebrex.   Baclofen for spasm.  Omeprazole for gastric protection.  The patient is planning to be discharged home with HHPT ([facility]) in care of her daughter, a Marine scientist in [place].  She lives with her husband.  Also will get help from a son who lives nearby.  Anticipated LOS less than 2 midnights.  Inpatient insurance removal due to - Age 83 and older with one or more of the following:  - Expected need for hospital services (PT, OT, Nursing) required for safe  discharge   Prudencio Burly III, PA-C 12/19/2018 2:08 PM

## 2018-12-24 ENCOUNTER — Encounter (HOSPITAL_COMMUNITY): Payer: Self-pay

## 2018-12-24 NOTE — Patient Instructions (Addendum)
DUE TO COVID-19 ONLY ONE VISITOR IS ALLOWED TO COME WITH YOU AND STAY IN THE WAITING ROOM ONLY DURING PRE OP AND PROCEDURE. THE ONE VISITOR MAY VISIT WITH YOU IN YOUR PRIVATE ROOM DURING VISITING HOURS ONLY!!   COVID SWAB TESTING MUST BE COMPLETED ON:  Friday, Dec. 11, 2020 0905 AM  [address], [place], [place] - the short stay covered drive at [facility] (Use the [employer] entrance to [facility] next to [facility].) (Must self quarantine after testing. Follow instructions on handout.)       Your procedure is scheduled on: Tuesday, Dec. 15, 2020   Report to [facility] Main  Entrance    Report to admitting at 10:40 AM   Call this number if you have problems the morning of surgery [phone]   Do not eat food  :After Midnight.   May have liquids until 10:10AM day of surgery   CLEAR LIQUID DIET  Foods Allowed                                                                     Foods Excluded  Water, Black Coffee and tea, regular and decaf                             liquids that you cannot  Plain Jell-O in any flavor  (No red)                                           see through such as: Fruit ices (not with fruit pulp)                                     milk, soups, orange juice  Iced Popsicles (No red)                                    All solid food Carbonated beverages, regular and diet                                    Apple juices Sports drinks like Gatorade (No red) Lightly seasoned clear broth or consume(fat free) Sugar, honey syrup  Sample Menu Breakfast                                Lunch                                     Supper Cranberry juice                    Beef broth  Chicken broth Jell-O                                     Grape juice                           Apple juice Coffee or tea                        Jell-O                                      Popsicle                    Coffee or tea                        Coffee or tea   Complete one Ensure drink the morning of surgery at 10:10 AM the day of surgery.   Brush your teeth the morning of surgery.   Do NOT smoke after Midnight   Take these medicines the morning of surgery with A SIP OF WATER: Levothyroxine                               You may not have any metal on your body including hair pins, jewelry, and body piercings             Do not wear make-up, lotions, powders, perfumes/cologne, or deodorant             Do not wear nail polish.  Do not shave  48 hours prior to surgery.                Do not bring valuables to the hospital. [place].   Contacts, dentures or bridgework may not be worn into surgery.   Bring small overnight bag day of surgery.    Special Instructions: Bring a copy of your healthcare power of attorney and living will documents         the day of surgery if you haven't scanned them in before.              Please read over the following fact sheets you were given:  [facility] - Preparing for Surgery Before surgery, you can play an important role.  Because skin is not sterile, your skin needs to be as free of germs as possible.  You can reduce the number of germs on your skin by washing with CHG (chlorahexidine gluconate) soap before surgery.  CHG is an antiseptic cleaner which kills germs and bonds with the skin to continue killing germs even after washing. Please DO NOT use if you have an allergy to CHG or antibacterial soaps.  If your skin becomes reddened/irritated stop using the CHG and inform your nurse when you arrive at Short Stay. Do not shave (including legs and underarms) for at least 48 hours prior to the first CHG shower.  You may shave your face/neck.  Please follow these instructions carefully:  1.  Shower with CHG Soap the night before surgery and the  morning  of surgery.  2.  If you choose to wash  your hair, wash your hair first as usual with your normal  shampoo.  3.  After you shampoo, rinse your hair and body thoroughly to remove the shampoo.                             4.  Use CHG as you would any other liquid soap.  You can apply chg directly to the skin and wash.  Gently with a scrungie or clean washcloth.  5.  Apply the CHG Soap to your body ONLY FROM THE NECK DOWN.   Do   not use on face/ open                           Wound or open sores. Avoid contact with eyes, ears mouth and   genitals (private parts).                       Wash face,  Genitals (private parts) with your normal soap.             6.  Wash thoroughly, paying special attention to the area where your    surgery  will be performed.  7.  Thoroughly rinse your body with warm water from the neck down.  8.  DO NOT shower/wash with your normal soap after using and rinsing off the CHG Soap.                9.  Pat yourself dry with a clean towel.            10.  Wear clean pajamas.            11.  Place clean sheets on your bed the night of your first shower and do not  sleep with pets. Day of Surgery : Do not apply any lotions/deodorants the morning of surgery.  Please wear clean clothes to the hospital/surgery center.  FAILURE TO FOLLOW THESE INSTRUCTIONS MAY RESULT IN THE CANCELLATION OF YOUR SURGERY  PATIENT SIGNATURE_________________________________  NURSE SIGNATURE__________________________________  ________________________________________________________________________   [name]  An incentive spirometer is a tool that can help keep your lungs clear and active. This tool measures how well you are filling your lungs with each breath. Taking long deep breaths may help reverse or decrease the chance of developing breathing (pulmonary) problems (especially infection) following:  A long period of time when you are unable to move or be active. BEFORE THE PROCEDURE   If the spirometer includes an  indicator to show your best effort, your nurse or respiratory therapist will set it to a desired goal.  If possible, sit up straight or lean slightly forward. Try not to slouch.  Hold the incentive spirometer in an upright position. INSTRUCTIONS FOR USE  1. Sit on the edge of your bed if possible, or sit up as far as you can in bed or on a chair. 2. Hold the incentive spirometer in an upright position. 3. Breathe out normally. 4. Place the mouthpiece in your mouth and seal your lips tightly around it. 5. Breathe in slowly and as deeply as possible, raising the piston or the ball toward the top of the column. 6. Hold your breath for 3-5 seconds or for as long as possible. Allow the piston or ball to fall to the bottom of the column. 7. Remove  the mouthpiece from your mouth and breathe out normally. 8. Rest for a few seconds and repeat Steps 1 through 7 at least 10 times every 1-2 hours when you are awake. Take your time and take a few normal breaths between deep breaths. 9. The spirometer may include an indicator to show your best effort. Use the indicator as a goal to work toward during each repetition. 10. After each set of 10 deep breaths, practice coughing to be sure your lungs are clear. If you have an incision (the cut made at the time of surgery), support your incision when coughing by placing a pillow or rolled up towels firmly against it. Once you are able to get out of bed, walk around indoors and cough well. You may stop using the incentive spirometer when instructed by your caregiver.  RISKS AND COMPLICATIONS  Take your time so you do not get dizzy or light-headed.  If you are in pain, you may need to take or ask for pain medication before doing incentive spirometry. It is harder to take a deep breath if you are having pain. AFTER USE  Rest and breathe slowly and easily.  It can be helpful to keep track of a log of your progress. Your caregiver can provide you with a simple table  to help with this. If you are using the spirometer at home, follow these instructions: [place] IF:   You are having difficultly using the spirometer.  You have trouble using the spirometer as often as instructed.  Your pain medication is not giving enough relief while using the spirometer.  You develop fever of 100.5 F (38.1 C) or higher. SEEK IMMEDIATE MEDICAL CARE IF:   You cough up bloody sputum that had not been present before.  You develop fever of 102 F (38.9 C) or greater.  You develop worsening pain at or near the incision site. MAKE SURE YOU:   Understand these instructions.  Will watch your condition.  Will get help right away if you are not doing well or get worse. Document Released: 05/16/2006 Document Revised: 03/28/2011 Document Reviewed: 07/17/2006 [facility] Patient Information 2014 [place], [place].   ________________________________________________________________________

## 2018-12-25 ENCOUNTER — Encounter (HOSPITAL_COMMUNITY)
Admission: RE | Admit: 2018-12-25 | Discharge: 2018-12-25 | Disposition: A | Discharge status: Home / Self Care | Payer: Medicare Other | Source: Ambulatory Visit | Attending: Orthopedic Surgery | Admitting: Orthopedic Surgery

## 2018-12-25 ENCOUNTER — Other Ambulatory Visit: Payer: Self-pay

## 2018-12-25 ENCOUNTER — Encounter (HOSPITAL_COMMUNITY): Payer: Self-pay

## 2018-12-25 DIAGNOSIS — I10 Essential (primary) hypertension: Secondary | ICD-10-CM | POA: Insufficient documentation

## 2018-12-25 DIAGNOSIS — E039 Hypothyroidism, unspecified: Secondary | ICD-10-CM | POA: Diagnosis not present

## 2018-12-25 DIAGNOSIS — Z7982 Long term (current) use of aspirin: Secondary | ICD-10-CM | POA: Diagnosis not present

## 2018-12-25 DIAGNOSIS — Z96651 Presence of right artificial knee joint: Secondary | ICD-10-CM | POA: Insufficient documentation

## 2018-12-25 DIAGNOSIS — Z79890 Hormone replacement therapy (postmenopausal): Secondary | ICD-10-CM | POA: Diagnosis not present

## 2018-12-25 DIAGNOSIS — Z01812 Encounter for preprocedural laboratory examination: Secondary | ICD-10-CM | POA: Insufficient documentation

## 2018-12-25 DIAGNOSIS — Z96652 Presence of left artificial knee joint: Secondary | ICD-10-CM | POA: Insufficient documentation

## 2018-12-25 DIAGNOSIS — M1611 Unilateral primary osteoarthritis, right hip: Secondary | ICD-10-CM | POA: Insufficient documentation

## 2018-12-25 DIAGNOSIS — E785 Hyperlipidemia, unspecified: Secondary | ICD-10-CM | POA: Diagnosis not present

## 2018-12-25 DIAGNOSIS — Z79899 Other long term (current) drug therapy: Secondary | ICD-10-CM | POA: Insufficient documentation

## 2018-12-25 HISTORY — DX: Other specified forms of tremor: G25.2

## 2018-12-25 HISTORY — DX: Other specified postprocedural states: Z98.890

## 2018-12-25 HISTORY — DX: Other specified postprocedural states: R11.2

## 2018-12-25 LAB — URINALYSIS, ROUTINE W REFLEX MICROSCOPIC
Bilirubin Urine: NEGATIVE
Glucose, UA: NEGATIVE mg/dL
Ketones, ur: NEGATIVE mg/dL
Leukocytes,Ua: NEGATIVE
Nitrite: NEGATIVE
Protein, ur: NEGATIVE mg/dL
Specific Gravity, Urine: 1.015 (ref 1.005–1.030)
pH: 7 (ref 5.0–8.0)

## 2018-12-25 LAB — BASIC METABOLIC PANEL
Anion gap: 9 (ref 5–15)
BUN: 16 mg/dL (ref 8–23)
CO2: 25 mmol/L (ref 22–32)
Calcium: 9.8 mg/dL (ref 8.9–10.3)
Chloride: 108 mmol/L (ref 98–111)
Creatinine, Ser: 0.91 mg/dL (ref 0.44–1.00)
GFR calc Af Amer: >60 mL/min (ref >60)
GFR calc non Af Amer: 58 mL/min — ABNORMAL LOW (ref >60)
Glucose, Bld: 108 mg/dL — ABNORMAL HIGH (ref 70–99)
Potassium: 4.4 mmol/L (ref 3.5–5.1)
Sodium: 142 mmol/L (ref 135–145)

## 2018-12-25 LAB — CBC
HCT: 45 % (ref 36.0–46.0)
Hemoglobin: 14.6 g/dL (ref 12.0–15.0)
MCH: 32.4 pg (ref 26.0–34.0)
MCHC: 32.4 g/dL (ref 30.0–36.0)
MCV: 99.8 fL (ref 80.0–100.0)
Platelets: 240 10*3/uL (ref 150–400)
RBC: 4.51 MIL/uL (ref 3.87–5.11)
RDW: 12.7 % (ref 11.5–15.5)
WBC: 7.9 10*3/uL (ref 4.0–10.5)
nRBC: 0 % (ref 0.0–0.2)

## 2018-12-25 LAB — SURGICAL PCR SCREEN
MRSA, PCR: NEGATIVE
Staphylococcus aureus: NEGATIVE

## 2018-12-25 NOTE — Progress Notes (Signed)
PCP - Dr. Diamantina Monks, Last office visit 11/30/2018 with B. Falstreau Cardiologist - Dr. Linard Millers Last office visit note and clearance 12/05/2018 in epic  Chest x-ray - greater than 1 year EKG - 12/05/2018 in epic Stress Test - N/A ECHO - N/A Cardiac Cath - N/A  Sleep Study - N/A CPAP - N/A  Fasting Blood Sugar - N/A Checks Blood Sugar __N/A___ times a day  Blood Thinner Instructions: N/A Aspirin Instructions: Yes Last Dose: 12/24/2018  Anesthesia review: N/A  Patient denies shortness of breath, fever, cough and chest pain at PAT appointment   Patient verbalized understanding of instructions that were given to them at the PAT appointment. Patient was also instructed that they will need to review over the PAT instructions again at home before surgery.

## 2018-12-26 NOTE — Anesthesia Preprocedure Evaluation (Addendum)
Anesthesia Evaluation  Patient identified by MRN, date of birth, ID band Patient awake    Reviewed: Allergy & Precautions, NPO status , Patient's Chart, lab work & pertinent test results  Airway Mallampati: III  TM Distance: >3 FB Neck ROM: Full    Dental no notable dental hx.    Pulmonary neg pulmonary ROS,    Pulmonary exam normal breath sounds clear to auscultation       Cardiovascular hypertension, Normal cardiovascular exam Rhythm:Regular Rate:Normal  ECG: NSR, rate 78  Pt seen by cardiologist, Dr. Daneen Schick, 12/05/2018 for preoperative evaluation.  Per OV note, "The cardiovascular stress associated with hip replacement surgery is low.  The patient has no heart failure or ischemic symptoms currently.  There is no contraindication to proceeding with right hip surgery.  Anticipated risk is low (less than 1% for cardiovascular complications)."   Neuro/Psych PSYCHIATRIC DISORDERS Depression negative neurological ROS     GI/Hepatic negative GI ROS, Neg liver ROS,   Endo/Other  Hypothyroidism   Renal/GU negative Renal ROS     Musculoskeletal  (+) Arthritis , Osteoarthritis,    Abdominal   Peds  Hematology HLD   Anesthesia Other Findings OA RIGHT HIP  Reproductive/Obstetrics                           Anesthesia Physical Anesthesia Plan  ASA: II  Anesthesia Plan: Spinal   Post-op Pain Management:    Induction: Intravenous  PONV Risk Score and Plan: 2 and Ondansetron, Dexamethasone, Propofol infusion and Treatment may vary due to age or medical condition  Airway Management Planned: Simple Face Mask  Additional Equipment:   Intra-op Plan:   Post-operative Plan:   Informed Consent: I have reviewed the patients History and Physical, chart, labs and discussed the procedure including the risks, benefits and alternatives for the proposed anesthesia with the patient or authorized  representative who has indicated his/her understanding and acceptance.     Dental advisory given  Plan Discussed with: CRNA  Anesthesia Plan Comments: (Reviewed  PAT note 12/25/2018, Konrad Felix, PA-C)       Anesthesia Quick Evaluation

## 2018-12-26 NOTE — Progress Notes (Signed)
Anesthesia Chart Review   Case: [id number] Date/Time: 01/01/19 1257   Procedure: TOTAL HIP ARTHROPLASTY ANTERIOR APPROACH (Right Hip)   Anesthesia type: Choice   Pre-op diagnosis: OA RIGHT HIP   Location: [place] [facility] / [facility]   Surgeon: Renette Butters, MD      DISCUSSION:85 y.o. never smoker with h/o PONV, HTN, hypothyroidism, right hip OA scheduled for above procedure 01/01/2019 with Dr. Edmonia Lynch.    Pt seen by cardiologist, Dr. Daneen Schick, 12/05/2018 for preoperative evaluation.  Per OV note, "The cardiovascular stress associated with hip replacement surgery is low.  The patient has no heart failure or ischemic symptoms currently.  There is no contraindication to proceeding with right hip surgery.  Anticipated risk is low (less than 1% for cardiovascular complications)."  Anticipate pt can proceed with planned procedure barring acute status change.   VS: BP (!) 185/92   Pulse 97   Temp 37.3 C (Oral)   Resp 16   Ht 5\' 8"  (1.727 m)   Wt 72.6 kg   SpO2 100%   BMI 24.33 kg/m   PROVIDERS: Chesley Noon, MD is PCP   Daneen Schick, MD is Cardiologist  LABS: Labs reviewed: Acceptable for surgery. (all labs ordered are listed, but only abnormal results are displayed)  Labs Reviewed  BASIC METABOLIC PANEL - Abnormal; Notable for the following components:      Result Value   Glucose, Bld 108 (*)    GFR calc non Af Amer 58 (*)    All other components within normal limits  URINALYSIS, ROUTINE W REFLEX MICROSCOPIC - Abnormal; Notable for the following components:   APPearance CLOUDY (*)    Hgb urine dipstick SMALL (*)    Bacteria, UA RARE (*)    All other components within normal limits  SURGICAL PCR SCREEN  CBC     IMAGES:   EKG: 12/05/2018 Rate 78 bpm Normal sinus rhythm  Nonspecific ST abnormality   CV:  Past Medical History:  Diagnosis Date  . Anemia    hx -2013 blood transfusion  polyps  . Arthritis   . Coarse tremors    Bilateral Hands and arms   . History of blood transfusion 2013  . Hyperlipemia   . Hypertension   . Hypothyroidism   . Nocturia   . PONV (postoperative nausea and vomiting)     Past Surgical History:  Procedure Laterality Date  . COLONOSCOPY    . FRACTURE SURGERY     left leg  rod  . JOINT REPLACEMENT Left    hip  . KNEE ARTHROSCOPY Right   . LUMBAR LAMINECTOMY/DECOMPRESSION MICRODISCECTOMY Right 04/10/2012   Procedure: LUMBAR LAMINECTOMY/DECOMPRESSION MICRODISCECTOMY 1 LEVEL;  Surgeon: Erline Levine, MD;  Location: [place] [facility];  Service: Neurosurgery;  Laterality: Right;  Right Lumbar three - four Microdiskectomy  . TOTAL HIP ARTHROPLASTY Left   . TOTAL KNEE ARTHROPLASTY Right 09/24/2014   Procedure: TOTAL KNEE ARTHROPLASTY;  Surgeon: Ninetta Lights, MD;  Location: [place];  Service: Orthopedics;  Laterality: Right;    MEDICATIONS: . aspirin EC 81 MG tablet  . cholecalciferol (VITAMIN D) 25 MCG (1000 UT) tablet  . docusate sodium (PHILLIPS STOOL SOFTENER) 100 MG capsule  . HYDROcodone-acetaminophen (NORCO) 7.5-325 MG per tablet  . levothyroxine (SYNTHROID, LEVOTHROID) 125 MCG tablet  . Multiple Vitamin (MULTIVITAMIN WITH MINERALS) TABS tablet  . Omega-3 Fatty Acids (FISH OIL) 1200 MG CAPS  . Polyethyl Glycol-Propyl Glycol (SYSTANE OP)  . rosuvastatin (CRESTOR) 10 MG tablet  No current facility-administered medications for this encounter.     [facility] Pre-Surgical Testing [phone] 12/26/18  1:23 PM

## 2018-12-28 ENCOUNTER — Other Ambulatory Visit (HOSPITAL_COMMUNITY)
Admission: RE | Admit: 2018-12-28 | Discharge: 2018-12-28 | Disposition: A | Discharge status: Home / Self Care | Payer: Medicare Other | Source: Ambulatory Visit | Attending: Orthopedic Surgery | Admitting: Orthopedic Surgery

## 2018-12-28 ENCOUNTER — Other Ambulatory Visit: Payer: Self-pay

## 2018-12-28 DIAGNOSIS — Z20828 Contact with and (suspected) exposure to other viral communicable diseases: Secondary | ICD-10-CM | POA: Diagnosis not present

## 2018-12-28 DIAGNOSIS — Z01812 Encounter for preprocedural laboratory examination: Secondary | ICD-10-CM | POA: Insufficient documentation

## 2018-12-28 LAB — SARS CORONAVIRUS 2 (TAT 6-24 HRS): SARS Coronavirus 2: NEGATIVE

## 2018-12-31 MED ORDER — BUPIVACAINE LIPOSOME 1.3 % IJ SUSP
10.0000 mL | Freq: Once | INTRAMUSCULAR | Status: DC
  Filled 2018-12-31: qty 10

## 2019-01-01 ENCOUNTER — Inpatient Hospital Stay (HOSPITAL_COMMUNITY): Payer: Medicare Other

## 2019-01-01 ENCOUNTER — Encounter (HOSPITAL_COMMUNITY): Payer: Self-pay | Admitting: Orthopedic Surgery

## 2019-01-01 ENCOUNTER — Inpatient Hospital Stay (HOSPITAL_COMMUNITY): Payer: Medicare Other | Admitting: Physician Assistant

## 2019-01-01 ENCOUNTER — Other Ambulatory Visit: Payer: Self-pay

## 2019-01-01 ENCOUNTER — Encounter (HOSPITAL_COMMUNITY)
Admission: RE | Disposition: A | Discharge status: Home Health | Payer: Self-pay | Source: Home / Self Care | Attending: Orthopedic Surgery

## 2019-01-01 ENCOUNTER — Inpatient Hospital Stay (HOSPITAL_COMMUNITY): Payer: Medicare Other | Admitting: Anesthesiology

## 2019-01-01 ENCOUNTER — Inpatient Hospital Stay (HOSPITAL_COMMUNITY)
Admission: RE | Admit: 2019-01-01 | Discharge: 2019-01-02 | DRG: 470 | Disposition: A | Discharge status: Home Health | Payer: Medicare Other | Attending: Orthopedic Surgery | Admitting: Orthopedic Surgery

## 2019-01-01 DIAGNOSIS — Z419 Encounter for procedure for purposes other than remedying health state, unspecified: Secondary | ICD-10-CM

## 2019-01-01 DIAGNOSIS — Z96651 Presence of right artificial knee joint: Secondary | ICD-10-CM | POA: Diagnosis present

## 2019-01-01 DIAGNOSIS — E7800 Pure hypercholesterolemia, unspecified: Secondary | ICD-10-CM | POA: Diagnosis present

## 2019-01-01 DIAGNOSIS — Z79890 Hormone replacement therapy (postmenopausal): Secondary | ICD-10-CM

## 2019-01-01 DIAGNOSIS — E785 Hyperlipidemia, unspecified: Secondary | ICD-10-CM | POA: Diagnosis present

## 2019-01-01 DIAGNOSIS — Z96642 Presence of left artificial hip joint: Secondary | ICD-10-CM | POA: Diagnosis present

## 2019-01-01 DIAGNOSIS — E039 Hypothyroidism, unspecified: Secondary | ICD-10-CM | POA: Diagnosis present

## 2019-01-01 DIAGNOSIS — Z20828 Contact with and (suspected) exposure to other viral communicable diseases: Secondary | ICD-10-CM | POA: Diagnosis present

## 2019-01-01 DIAGNOSIS — Z833 Family history of diabetes mellitus: Secondary | ICD-10-CM | POA: Diagnosis not present

## 2019-01-01 DIAGNOSIS — M1610 Unilateral primary osteoarthritis, unspecified hip: Secondary | ICD-10-CM | POA: Diagnosis present

## 2019-01-01 DIAGNOSIS — Z7982 Long term (current) use of aspirin: Secondary | ICD-10-CM

## 2019-01-01 DIAGNOSIS — M1611 Unilateral primary osteoarthritis, right hip: Principal | ICD-10-CM | POA: Diagnosis present

## 2019-01-01 DIAGNOSIS — I10 Essential (primary) hypertension: Secondary | ICD-10-CM | POA: Diagnosis present

## 2019-01-01 DIAGNOSIS — Z823 Family history of stroke: Secondary | ICD-10-CM | POA: Diagnosis not present

## 2019-01-01 DIAGNOSIS — Z8249 Family history of ischemic heart disease and other diseases of the circulatory system: Secondary | ICD-10-CM

## 2019-01-01 DIAGNOSIS — Z79899 Other long term (current) drug therapy: Secondary | ICD-10-CM

## 2019-01-01 HISTORY — PX: TOTAL HIP ARTHROPLASTY: SHX124

## 2019-01-01 SURGERY — ARTHROPLASTY, HIP, TOTAL, ANTERIOR APPROACH
Anesthesia: Spinal | Site: Hip | Laterality: Right

## 2019-01-01 MED ORDER — DOCUSATE SODIUM 100 MG PO CAPS
100.0000 mg | ORAL_CAPSULE | Freq: Two times a day (BID) | ORAL | Status: DC
  Administered 2019-01-01 – 2019-01-02 (×2): 100 mg via ORAL
  Filled 2019-01-01 (×2): qty 1

## 2019-01-01 MED ORDER — LIDOCAINE 2% (20 MG/ML) 5 ML SYRINGE
INTRAMUSCULAR | Status: AC
  Filled 2019-01-01: qty 20

## 2019-01-01 MED ORDER — ACETAMINOPHEN 325 MG PO TABS: 325.0000 mg | ORAL_TABLET | Freq: Four times a day (QID) | ORAL | Status: DC | PRN

## 2019-01-01 MED ORDER — SUCCINYLCHOLINE CHLORIDE 200 MG/10ML IV SOSY
PREFILLED_SYRINGE | INTRAVENOUS | Status: AC
  Filled 2019-01-01: qty 10

## 2019-01-01 MED ORDER — OMEPRAZOLE 20 MG PO CPDR: 20.0000 mg | DELAYED_RELEASE_CAPSULE | Freq: Every day | ORAL | 0 refills | Status: DC

## 2019-01-01 MED ORDER — EPHEDRINE 5 MG/ML INJ
INTRAVENOUS | Status: AC
  Filled 2019-01-01: qty 20

## 2019-01-01 MED ORDER — LACTATED RINGERS IV SOLN: INTRAVENOUS | Status: DC

## 2019-01-01 MED ORDER — PANTOPRAZOLE SODIUM 40 MG PO TBEC
40.0000 mg | DELAYED_RELEASE_TABLET | Freq: Every day | ORAL | Status: DC
  Administered 2019-01-01 – 2019-01-02 (×2): 40 mg via ORAL
  Filled 2019-01-01 (×2): qty 1

## 2019-01-01 MED ORDER — CEFAZOLIN SODIUM-DEXTROSE 1-4 GM/50ML-% IV SOLN
1.0000 g | Freq: Four times a day (QID) | INTRAVENOUS | Status: AC
  Administered 2019-01-01 (×2): 1 g via INTRAVENOUS
  Filled 2019-01-01 (×2): qty 50

## 2019-01-01 MED ORDER — ASPIRIN EC 81 MG PO TBEC: 81.0000 mg | DELAYED_RELEASE_TABLET | Freq: Two times a day (BID) | ORAL | 0 refills | Status: DC

## 2019-01-01 MED ORDER — TRANEXAMIC ACID-NACL 1000-0.7 MG/100ML-% IV SOLN
1000.0000 mg | INTRAVENOUS | Status: AC
  Administered 2019-01-01: 14:00:00 1000 mg via INTRAVENOUS
  Filled 2019-01-01: qty 100

## 2019-01-01 MED ORDER — FENTANYL CITRATE (PF) 100 MCG/2ML IJ SOLN
INTRAMUSCULAR | Status: AC
  Administered 2019-01-01: 16:00:00 25 ug via INTRAVENOUS
  Filled 2019-01-01: qty 2

## 2019-01-01 MED ORDER — POVIDONE-IODINE 10 % EX SWAB: 2.0000 "application " | Freq: Once | CUTANEOUS | Status: AC

## 2019-01-01 MED ORDER — PHENYLEPHRINE 40 MCG/ML (10ML) SYRINGE FOR IV PUSH (FOR BLOOD PRESSURE SUPPORT)
PREFILLED_SYRINGE | INTRAVENOUS | Status: AC
  Filled 2019-01-01: qty 20

## 2019-01-01 MED ORDER — MIDAZOLAM HCL 2 MG/2ML IJ SOLN
INTRAMUSCULAR | Status: AC
  Filled 2019-01-01: qty 2

## 2019-01-01 MED ORDER — HYDROCODONE-ACETAMINOPHEN 5-325 MG PO TABS
1.0000 | ORAL_TABLET | ORAL | Status: DC | PRN
  Administered 2019-01-01: 2 via ORAL
  Filled 2019-01-01: qty 2

## 2019-01-01 MED ORDER — LIDOCAINE 2% (20 MG/ML) 5 ML SYRINGE
INTRAMUSCULAR | Status: DC | PRN
  Administered 2019-01-01: 20 mg via INTRAVENOUS
  Administered 2019-01-01: 40 mg via INTRAVENOUS
  Administered 2019-01-01: 20 mg via INTRAVENOUS

## 2019-01-01 MED ORDER — ONDANSETRON HCL 4 MG PO TABS: 4.0000 mg | ORAL_TABLET | Freq: Four times a day (QID) | ORAL | Status: DC | PRN

## 2019-01-01 MED ORDER — PROPOFOL 10 MG/ML IV BOLUS
INTRAVENOUS | Status: DC | PRN
  Administered 2019-01-01 (×4): 10 mg via INTRAVENOUS
  Administered 2019-01-01: 20 mg via INTRAVENOUS
  Administered 2019-01-01 (×2): 10 mg via INTRAVENOUS

## 2019-01-01 MED ORDER — BACLOFEN 10 MG PO TABS: 10.0000 mg | ORAL_TABLET | Freq: Two times a day (BID) | ORAL | 0 refills | Status: DC

## 2019-01-01 MED ORDER — FENTANYL CITRATE (PF) 250 MCG/5ML IJ SOLN
INTRAMUSCULAR | Status: AC
  Filled 2019-01-01: qty 5

## 2019-01-01 MED ORDER — MENTHOL 3 MG MT LOZG: 1.0000 | LOZENGE | OROMUCOSAL | Status: DC | PRN

## 2019-01-01 MED ORDER — CHLORHEXIDINE GLUCONATE 4 % EX LIQD: 60.0000 mL | Freq: Once | CUTANEOUS | Status: DC

## 2019-01-01 MED ORDER — ONDANSETRON HCL 4 MG/2ML IJ SOLN: 4.0000 mg | Freq: Once | INTRAMUSCULAR | Status: DC | PRN

## 2019-01-01 MED ORDER — DEXAMETHASONE SODIUM PHOSPHATE 10 MG/ML IJ SOLN
INTRAMUSCULAR | Status: DC | PRN
  Administered 2019-01-01: 5 mg via INTRAVENOUS

## 2019-01-01 MED ORDER — ACETAMINOPHEN 500 MG PO TABS
500.0000 mg | ORAL_TABLET | Freq: Four times a day (QID) | ORAL | Status: AC
  Administered 2019-01-01 – 2019-01-02 (×4): 500 mg via ORAL
  Filled 2019-01-01 (×4): qty 1

## 2019-01-01 MED ORDER — ONDANSETRON HCL 4 MG PO TABS: 4.0000 mg | ORAL_TABLET | Freq: Three times a day (TID) | ORAL | 0 refills | Status: DC | PRN

## 2019-01-01 MED ORDER — MAGNESIUM CITRATE PO SOLN: 1.0000 | Freq: Once | ORAL | Status: DC | PRN

## 2019-01-01 MED ORDER — 0.9 % SODIUM CHLORIDE (POUR BTL) OPTIME
TOPICAL | Status: DC | PRN
  Administered 2019-01-01: 1000 mL

## 2019-01-01 MED ORDER — SORBITOL 70 % SOLN
30.0000 mL | Freq: Every day | Status: DC | PRN
  Filled 2019-01-01: qty 30

## 2019-01-01 MED ORDER — METOCLOPRAMIDE HCL 5 MG PO TABS: 5.0000 mg | ORAL_TABLET | Freq: Three times a day (TID) | ORAL | Status: DC | PRN

## 2019-01-01 MED ORDER — ONDANSETRON HCL 4 MG/2ML IJ SOLN
INTRAMUSCULAR | Status: AC
  Filled 2019-01-01: qty 4

## 2019-01-01 MED ORDER — SODIUM CHLORIDE (PF) 0.9 % IJ SOLN
INTRAMUSCULAR | Status: AC
  Filled 2019-01-01: qty 20

## 2019-01-01 MED ORDER — POVIDONE-IODINE 10 % EX SWAB
2.0000 "application " | Freq: Once | CUTANEOUS | Status: AC
  Administered 2019-01-01: 2 via TOPICAL

## 2019-01-01 MED ORDER — ONDANSETRON HCL 4 MG/2ML IJ SOLN
INTRAMUSCULAR | Status: DC | PRN
  Administered 2019-01-01: 4 mg via INTRAVENOUS

## 2019-01-01 MED ORDER — POLYETHYLENE GLYCOL 3350 17 G PO PACK: 17.0000 g | PACK | Freq: Every day | ORAL | Status: DC | PRN

## 2019-01-01 MED ORDER — PHENOL 1.4 % MT LIQD: 1.0000 | OROMUCOSAL | Status: DC | PRN

## 2019-01-01 MED ORDER — PROPOFOL 500 MG/50ML IV EMUL
INTRAVENOUS | Status: AC
  Filled 2019-01-01: qty 150

## 2019-01-01 MED ORDER — FENTANYL CITRATE (PF) 100 MCG/2ML IJ SOLN
25.0000 ug | INTRAMUSCULAR | Status: DC | PRN
  Administered 2019-01-01: 25 ug via INTRAVENOUS

## 2019-01-01 MED ORDER — CEFAZOLIN SODIUM-DEXTROSE 2-4 GM/100ML-% IV SOLN
2.0000 g | INTRAVENOUS | Status: AC
  Administered 2019-01-01: 14:00:00 2 g via INTRAVENOUS
  Filled 2019-01-01: qty 100

## 2019-01-01 MED ORDER — DEXAMETHASONE SODIUM PHOSPHATE 10 MG/ML IJ SOLN
10.0000 mg | Freq: Once | INTRAMUSCULAR | Status: AC
  Administered 2019-01-02: 06:00:00 10 mg via INTRAVENOUS
  Filled 2019-01-01: qty 1

## 2019-01-01 MED ORDER — HYDROCODONE-ACETAMINOPHEN 5-325 MG PO TABS: 1.0000 | ORAL_TABLET | Freq: Four times a day (QID) | ORAL | 0 refills | Status: DC | PRN

## 2019-01-01 MED ORDER — POLYVINYL ALCOHOL 1.4 % OP SOLN
1.0000 [drp] | Freq: Two times a day (BID) | OPHTHALMIC | Status: DC
  Administered 2019-01-01: 23:00:00 1 [drp] via OPHTHALMIC
  Filled 2019-01-01: qty 15

## 2019-01-01 MED ORDER — SODIUM CHLORIDE FLUSH 0.9 % IV SOLN
INTRAVENOUS | Status: DC | PRN
  Administered 2019-01-01: 20 mL

## 2019-01-01 MED ORDER — ROSUVASTATIN CALCIUM 10 MG PO TABS
10.0000 mg | ORAL_TABLET | Freq: Every day | ORAL | Status: DC
  Administered 2019-01-01 – 2019-01-02 (×2): 10 mg via ORAL
  Filled 2019-01-01 (×2): qty 1

## 2019-01-01 MED ORDER — BUPIVACAINE LIPOSOME 1.3 % IJ SUSP
INTRAMUSCULAR | Status: DC | PRN
  Administered 2019-01-01: 10 mL

## 2019-01-01 MED ORDER — FENTANYL CITRATE (PF) 250 MCG/5ML IJ SOLN
INTRAMUSCULAR | Status: DC | PRN
  Administered 2019-01-01 (×6): 25 ug via INTRAVENOUS

## 2019-01-01 MED ORDER — LEVOTHYROXINE SODIUM 125 MCG PO TABS
125.0000 ug | ORAL_TABLET | Freq: Every day | ORAL | Status: DC
  Administered 2019-01-02: 125 ug via ORAL
  Filled 2019-01-01: qty 1

## 2019-01-01 MED ORDER — BACLOFEN 10 MG PO TABS: 10.0000 mg | ORAL_TABLET | Freq: Three times a day (TID) | ORAL | Status: DC | PRN

## 2019-01-01 MED ORDER — ONDANSETRON HCL 4 MG/2ML IJ SOLN
4.0000 mg | Freq: Four times a day (QID) | INTRAMUSCULAR | Status: DC | PRN
  Administered 2019-01-01: 19:00:00 4 mg via INTRAVENOUS
  Filled 2019-01-01: qty 2

## 2019-01-01 MED ORDER — ACETAMINOPHEN 500 MG PO TABS
1000.0000 mg | ORAL_TABLET | Freq: Once | ORAL | Status: AC
  Administered 2019-01-01: 12:00:00 1000 mg via ORAL
  Filled 2019-01-01: qty 2

## 2019-01-01 MED ORDER — CELECOXIB 100 MG PO CAPS: 100.0000 mg | ORAL_CAPSULE | Freq: Two times a day (BID) | ORAL | 0 refills | Status: AC

## 2019-01-01 MED ORDER — PROPOFOL 500 MG/50ML IV EMUL
INTRAVENOUS | Status: DC | PRN
  Administered 2019-01-01: 25 ug/kg/min via INTRAVENOUS

## 2019-01-01 MED ORDER — DEXAMETHASONE SODIUM PHOSPHATE 10 MG/ML IJ SOLN
INTRAMUSCULAR | Status: AC
  Filled 2019-01-01: qty 2

## 2019-01-01 MED ORDER — ASPIRIN 81 MG PO CHEW
81.0000 mg | CHEWABLE_TABLET | Freq: Two times a day (BID) | ORAL | Status: DC
  Administered 2019-01-01 – 2019-01-02 (×2): 81 mg via ORAL
  Filled 2019-01-01 (×2): qty 1

## 2019-01-01 MED ORDER — DIPHENHYDRAMINE HCL 12.5 MG/5ML PO ELIX: 12.5000 mg | ORAL_SOLUTION | ORAL | Status: DC | PRN

## 2019-01-01 MED ORDER — METOCLOPRAMIDE HCL 5 MG/ML IJ SOLN: 5.0000 mg | Freq: Three times a day (TID) | INTRAMUSCULAR | Status: DC | PRN

## 2019-01-01 MED ORDER — CELECOXIB 100 MG PO CAPS
100.0000 mg | ORAL_CAPSULE | Freq: Two times a day (BID) | ORAL | Status: DC
  Administered 2019-01-01 – 2019-01-02 (×2): 100 mg via ORAL
  Filled 2019-01-01 (×3): qty 1

## 2019-01-01 MED ORDER — HYDROMORPHONE HCL 1 MG/ML IJ SOLN: 0.2500 mg | INTRAMUSCULAR | Status: DC | PRN

## 2019-01-01 MED ORDER — MEPIVACAINE HCL (PF) 2 % IJ SOLN
INTRAMUSCULAR | Status: AC
  Filled 2019-01-01: qty 20

## 2019-01-01 MED ORDER — ACETAMINOPHEN 500 MG PO TABS: 1000.0000 mg | ORAL_TABLET | Freq: Once | ORAL | Status: DC

## 2019-01-01 MED ORDER — ROCURONIUM BROMIDE 10 MG/ML (PF) SYRINGE
PREFILLED_SYRINGE | INTRAVENOUS | Status: AC
  Filled 2019-01-01: qty 20

## 2019-01-01 MED ORDER — PHENYLEPHRINE HCL (PRESSORS) 10 MG/ML IV SOLN
INTRAVENOUS | Status: AC
  Filled 2019-01-01: qty 2

## 2019-01-01 SURGICAL SUPPLY — 45 items
BAG ZIPLOCK 12X15 (MISCELLANEOUS) ×3 IMPLANT
BLADE SAG 18X100X1.27 (BLADE) ×3 IMPLANT
BLADE SURG SZ10 CARB STEEL (BLADE) ×3 IMPLANT
CHLORAPREP W/TINT 26 (MISCELLANEOUS) ×3 IMPLANT
CLOSURE STERI-STRIP 1/2X4 (GAUZE/BANDAGES/DRESSINGS) ×1
CLSR STERI-STRIP ANTIMIC 1/2X4 (GAUZE/BANDAGES/DRESSINGS) ×2 IMPLANT
COVER PERINEAL POST (MISCELLANEOUS) ×3 IMPLANT
COVER SURGICAL LIGHT HANDLE (MISCELLANEOUS) ×3 IMPLANT
COVER WAND RF STERILE (DRAPES) IMPLANT
DECANTER SPIKE VIAL GLASS SM (MISCELLANEOUS) ×3 IMPLANT
DRAPE IMP U-DRAPE 54X76 (DRAPES) ×3 IMPLANT
DRAPE STERI IOBAN 125X83 (DRAPES) ×3 IMPLANT
DRAPE U-SHAPE 47X51 STRL (DRAPES) ×6 IMPLANT
DRSG MEPILEX BORDER 4X8 (GAUZE/BANDAGES/DRESSINGS) ×3 IMPLANT
ELECT BLADE TIP CTD 4 INCH (ELECTRODE) ×3 IMPLANT
ELECT REM PT RETURN 15FT ADLT (MISCELLANEOUS) ×3 IMPLANT
GLOVE BIO SURGEON STRL SZ7.5 (GLOVE) ×6 IMPLANT
GLOVE BIOGEL PI IND STRL 8 (GLOVE) ×2 IMPLANT
GLOVE BIOGEL PI INDICATOR 8 (GLOVE) ×4
GOWN STRL REUS W/TWL LRG LVL3 (GOWN DISPOSABLE) ×3 IMPLANT
GOWN STRL REUS W/TWL XL LVL3 (GOWN DISPOSABLE) ×3 IMPLANT
HEAD BIOLOX HIP 36/-2.5 (Joint) ×1 IMPLANT
HIP BIOLOX HD 36/-2.5 (Joint) ×3 IMPLANT
HOLDER FOLEY CATH W/STRAP (MISCELLANEOUS) ×3 IMPLANT
INSERT POLYETHYLENE 36M-0 (Insert) ×3 IMPLANT
KIT TURNOVER KIT A (KITS) ×3 IMPLANT
MANIFOLD NEPTUNE II (INSTRUMENTS) ×3 IMPLANT
NS IRRIG 1000ML POUR BTL (IV SOLUTION) IMPLANT
PACK ANTERIOR HIP CUSTOM (KITS) ×3 IMPLANT
PENCIL SMOKE EVACUATOR (MISCELLANEOUS) IMPLANT
PROTECTOR NERVE ULNAR (MISCELLANEOUS) ×3 IMPLANT
SCREW HEX LP 6.5X20 (Screw) ×3 IMPLANT
SHELL TRIDENT II CLUST 50 (Shell) ×3 IMPLANT
STEM HIP 127 DEG (Stem) ×3 IMPLANT
SUT MNCRL AB 4-0 PS2 18 (SUTURE) ×3 IMPLANT
SUT STRATAFIX 0 PDS 27 VIOLET (SUTURE) ×3
SUT VIC AB 0 CT1 36 (SUTURE) ×3 IMPLANT
SUT VIC AB 1 CT1 36 (SUTURE) ×3 IMPLANT
SUT VIC AB 2-0 CT1 27 (SUTURE) ×4
SUT VIC AB 2-0 CT1 TAPERPNT 27 (SUTURE) ×2 IMPLANT
SUTURE STRATFX 0 PDS 27 VIOLET (SUTURE) ×1 IMPLANT
TRAY FOLEY MTR SLVR 14FR STAT (SET/KITS/TRAYS/PACK) ×3 IMPLANT
TRAY FOLEY MTR SLVR 16FR STAT (SET/KITS/TRAYS/PACK) IMPLANT
WATER STERILE IRR 1000ML POUR (IV SOLUTION) ×6 IMPLANT
YANKAUER SUCT BULB TIP 10FT TU (MISCELLANEOUS) ×3 IMPLANT

## 2019-01-01 NOTE — Anesthesia Procedure Notes (Signed)
Date/Time: 01/01/2019 1:28 PM Performed by: Cynda Familia, CRNA Pre-anesthesia Checklist: Patient identified, Emergency Drugs available, Suction available, Patient being monitored and Timeout performed Patient Re-evaluated:Patient Re-evaluated prior to induction Oxygen Delivery Method: Simple face mask Placement Confirmation: positive ETCO2 and breath sounds checked- equal and bilateral Dental Injury: Teeth and Oropharynx as per pre-operative assessment

## 2019-01-01 NOTE — Op Note (Signed)
01/01/2019  1:41 PM  PATIENT:  [name]   MRN: [id number]  PRE-OPERATIVE DIAGNOSIS:  OA RIGHT HIP  POST-OPERATIVE DIAGNOSIS:  * No post-op diagnosis entered *  PROCEDURE:  Procedure(s): TOTAL HIP ARTHROPLASTY ANTERIOR APPROACH  PREOPERATIVE INDICATIONS:    [name] is an 85 y.o. female who has a diagnosis of Primary osteoarthritis of right hip and elected for surgical management after failing conservative treatment.  The risks benefits and alternatives were discussed with the patient including but not limited to the risks of nonoperative treatment, versus surgical intervention including infection, bleeding, nerve injury, periprosthetic fracture, the need for revision surgery, dislocation, leg length discrepancy, blood clots, cardiopulmonary complications, morbidity, mortality, among others, and they were willing to proceed.     OPERATIVE REPORT     SURGEON:   Renette Butters, MD    ASSISTANT:  Roxan Hockey, PA-C, he was present and scrubbed throughout the case, critical for completion in a timely fashion, and for retraction, instrumentation, and closure.     ANESTHESIA:  General    COMPLICATIONS:  None.     COMPONENTS:  Stryker acolade fit femur size 5 with a 36 mm -2.5 head ball and an acetabular shell size 50 with a  polyethylene liner    PROCEDURE IN DETAIL:   The patient was met in the holding area and  identified.  The appropriate hip was identified and marked at the operative site.  The patient was then transported to the OR  and  placed under anesthesia per that record.  At that point, the patient was  placed in the supine position and  secured to the operating room table and all bony prominences padded. He received pre-operative antibiotics    The operative lower extremity was prepped from the iliac crest to the distal leg.  Sterile draping was performed.  Time out was performed prior to incision.      Skin incision was made just 2 cm  lateral to the ASIS  extending in line with the tensor fascia lata. Electrocautery was used to control all bleeders. I dissected down sharply to the fascia of the tensor fascia lata was confirmed that the muscle fibers beneath were running posteriorly. I then incised the fascia over the superficial tensor fascia lata in line with the incision. The fascia was elevated off the anterior aspect of the muscle the muscle was retracted posteriorly and protected throughout the case. I then used electrocautery to incise the tensor fascia lata fascia control and all bleeders. Immediately visible was the fat over top of the anterior neck and capsule.  I removed the anterior fat from the capsule and elevated the rectus muscle off of the anterior capsule. I then removed a large time of capsule. The retractors were then placed over the anterior acetabulum as well as around the superior and inferior neck.  I then made a femoral neck cut. Then used the power corkscrew to remove the femoral head from the acetabulum and thoroughly irrigated the acetabulum. I sized the femoral head.    I then exposed the deep acetabulum, cleared out any tissue including the ligamentum teres.   After adequate visualization, I excised the labrum, and then sequentially reamed.  I then impacted the acetabular implant into place using fluoroscopy for guidance.  Appropriate version and inclination was confirmed clinically matching their bony anatomy, and with fluoroscopy.  I placed a 20 mm screw in the posterior/superio position with an excellent bite.    I then placed  the polyethylene liner in place  I then adducted the leg and released the external rotators from the posterior femur allowing it to be easily delivered up lateral and anterior to the acetabulum for preparation of the femoral canal.    I then prepared the proximal femur using the cookie-cutter and then sequentially reamed and broached.  A trial broach, neck, and head was  utilized, and I reduced the hip and used floroscopy to assess the neck length and femoral implant.  I then impacted the femoral prosthesis into place into the appropriate version. The hip was then reduced and fluoroscopy confirmed appropriate position. Leg lengths were restored.  I then irrigated the hip copiously again with, and repaired the fascia with Vicryl, followed by monocryl for the subcutaneous tissue, Monocryl for the skin, Steri-Strips and sterile gauze. The patient was then awakened and returned to PACU in stable and satisfactory condition. There were no complications.  POST OPERATIVE PLAN: WBAT, DVT px: SCD's/TED, ambulation and chemical dvt px  Edmonia Lynch, MD Orthopedic Surgeon [phone]

## 2019-01-01 NOTE — Transfer of Care (Signed)
Immediate Anesthesia Transfer of Care Note  Patient: [name]  Procedure(s) Performed: TOTAL HIP ARTHROPLASTY ANTERIOR APPROACH (Right Hip)  Patient Location: PACU  Anesthesia Type:Spinal  Level of Consciousness: sedated  Airway & Oxygen Therapy: Patient Spontanous Breathing and Patient connected to face mask oxygen  Post-op Assessment: Report given to RN and Post -op Vital signs reviewed and stable  Post vital signs: Reviewed and stable  Last Vitals:  Vitals Value Taken Time  BP 116/74 01/01/19 1531  Temp    Pulse 73 01/01/19 1534  Resp 15 01/01/19 1534  SpO2 100 % 01/01/19 1534  Vitals shown include unvalidated device data.  Last Pain:  Vitals:   01/01/19 1128  TempSrc:   PainSc: 0-No pain      Patients Stated Pain Goal: 4 ([id number])  Complications: No apparent anesthesia complications

## 2019-01-01 NOTE — Evaluation (Signed)
Physical Therapy Evaluation Patient Details Name: [name] MRN: [id number] DOB: 02/26/1933 Today's Date: 01/01/2019   History of Present Illness  Pt s/p R DATHA 01/01/2019. and history of lumbar back surgery 2014 and RTKA in 2016, and LTHA ( date not specified) .  Clinical Impression  Pt is s/p R DATHA resulting in the deficits listed below (see PT Problem List).  Pt will benefit from acute PT to increase independence and safety with mobility to allow safe discharge home with husband and dtr assisting. Will continue to educate and train with execises, ambulation and stair training. Pt limited this evening due to high BP and nauseaouness ( vomiting a little ) at edge of bed.       Follow Up Recommendations Follow surgeon's recommendation for DC plan and follow-up therapies(chart states HHPT with [facility])    Equipment Recommendations  None recommended by PT    Recommendations for Other Services       Precautions / Restrictions        Mobility  Bed Mobility Overal bed mobility: Needs Assistance Bed Mobility: Supine to Sit;Sit to Supine     Supine to sit: Min assist Sit to supine: Min assist   General bed mobility comments: Min assist with LE and just cues for how to assist herself with UEs and rails  Transfers Overall transfer level: (ot tested due to pt with high BP and sitting EOB nauseated.)                  Ambulation/Gait                Stairs            Wheelchair Mobility    Modified Rankin (Stroke Patients Only)       Balance                                             Pertinent Vitals/Pain Pain Assessment: 0-10 Pain Score: 2  Pain Location: R hip Pain Descriptors / Indicators: Aching Pain Intervention(s): Limited activity within patient's tolerance;Monitored during session;Ice applied    Home Living Family/patient expects to be discharged to:: Private residence Living Arrangements:  Spouse/significant other Available Help at Discharge: Family(dtr is OR nurse at [employer] and able to help as well) Type of Home: House Home Access: Stairs to enter   [employer] of Steps: 2(2 with no rails, 4 with rail) Home Layout: One level Home Equipment: [place] - 2 wheels;Bedside commode;Cane - single point      Prior Function Level of Independence: Independent         Comments: pt was occassionally using cane due to hip pain , and used ther RW for past 2-3 weeks to be safe.     Hand Dominance        Extremity/Trunk Assessment        Lower Extremity Assessment Lower Extremity Assessment: Overall WFL for tasks assessed       Communication   Communication: No difficulties  Cognition Arousal/Alertness: Awake/alert Behavior During Therapy: WFL for tasks assessed/performed Overall Cognitive Status: Within Functional Limits for tasks assessed                                        General Comments  Exercises Total Joint Exercises Ankle Circles/Pumps: AROM;Right;10 reps;Supine Quad Sets: AROM;5 reps;Right;Supine Heel Slides: AAROM;Supine;Right;5 reps Hip ABduction/ADduction: AAROM;Supine;Right;5 reps   Assessment/Plan    PT Assessment Patient needs continued PT services  PT Problem List Decreased activity tolerance;Decreased strength;Decreased range of motion;Decreased mobility       PT Treatment Interventions DME instruction;Gait training;Stair training;Functional mobility training;Therapeutic activities;Therapeutic exercise    PT Goals (Current goals can be found in the Care Plan section)  Acute Rehab PT Goals Patient Stated Goal: I want to be able to get up and walk and move again PT Goal Formulation: With patient Time For Goal Achievement: 01/08/19 Potential to Achieve Goals: Good    Frequency 7X/week   Barriers to discharge        Co-evaluation               AM-PAC PT "6 Clicks" Mobility  Outcome Measure  Help needed turning from your back to your side while in a flat bed without using bedrails?: A Little Help needed moving from lying on your back to sitting on the side of a flat bed without using bedrails?: A Little Help needed moving to and from a bed to a chair (including a wheelchair)?: A Lot Help needed standing up from a chair using your arms (e.g., wheelchair or bedside chair)?: A Lot Help needed to walk in hospital room?: A Lot Help needed climbing 3-5 steps with a railing? : A Lot 6 Click Score: 14    End of Session   Activity Tolerance: Patient limited by fatigue(and N & V) Patient left: in bed;with call bell/phone within reach;with bed alarm set;with family/visitor present Nurse Communication: Mobility status PT Visit Diagnosis: Other abnormalities of gait and mobility (R26.89);Difficulty in walking, not elsewhere classified (R26.2)    Time: 1900-1945 PT Time Calculation (min) (ACUTE ONLY): 45 min   Charges:   PT Evaluation $PT Eval Low Complexity: 1 Low PT Treatments $Therapeutic Exercise: 8-22 mins        Gatha Mayer, PT, MPT Acute Rehabilitation Services Office: [phone] 01/01/2019   Clide Dales 01/01/2019, 7:51 PM

## 2019-01-01 NOTE — Anesthesia Procedure Notes (Signed)
Spinal  Patient location during procedure: OR Start time: 01/01/2019 1:40 PM End time: 01/01/2019 1:50 PM Staffing Performed: anesthesiologist  Anesthesiologist: Murvin Natal, MD Preanesthetic Checklist Completed: patient identified, IV checked, risks and benefits discussed, surgical consent, monitors and equipment checked, pre-op evaluation and timeout performed Spinal Block Patient position: sitting Prep: DuraPrep Patient monitoring: cardiac monitor, continuous pulse ox and blood pressure Approach: midline Location: L4-5 Injection technique: single-shot Needle Needle type: Pencan  Needle gauge: 24 G Needle length: 9 cm Assessment Sensory level: T10 Additional Notes Functioning IV was confirmed and monitors were applied. Sterile prep and drape, including hand hygiene and sterile gloves were used. The patient was positioned and the spine was prepped. The skin was anesthetized with lidocaine.  Free flow of clear CSF was obtained on the third attempt prior to injecting local anesthetic into the CSF.  The spinal needle aspirated freely following injection.  The needle was carefully withdrawn.  The patient tolerated the procedure well.

## 2019-01-01 NOTE — Discharge Instructions (Signed)

## 2019-01-01 NOTE — Interval H&P Note (Signed)
I participated in the care of this patient and agree with the above history, physical and evaluation. I performed a review of the history and a physical exam as detailed   Mendy Chou Daniel Mikayela Deats MD  

## 2019-01-02 ENCOUNTER — Encounter: Payer: Self-pay | Admitting: *Deleted

## 2019-01-02 MED ORDER — BUPIVACAINE IN DEXTROSE 0.75-8.25 % IT SOLN
INTRATHECAL | Status: DC | PRN
  Administered 2019-01-01: 1.8 mL via INTRATHECAL

## 2019-01-02 NOTE — Anesthesia Postprocedure Evaluation (Signed)
Anesthesia Post Note  Patient: [name]  Procedure(s) Performed: TOTAL HIP ARTHROPLASTY ANTERIOR APPROACH (Right Hip)     Patient location during evaluation: PACU Anesthesia Type: Spinal Level of consciousness: oriented and awake and alert Pain management: pain level controlled Vital Signs Assessment: post-procedure vital signs reviewed and stable Respiratory status: spontaneous breathing, respiratory function stable and patient connected to nasal cannula oxygen Cardiovascular status: blood pressure returned to baseline and stable Postop Assessment: no headache, no backache, no apparent nausea or vomiting and spinal receding Anesthetic complications: no    Last Vitals:  Vitals:   01/02/19 0124 01/02/19 0556  BP: 124/67 (!) 146/71  Pulse: 62 65  Resp: 17 18  Temp: 37 C 37.1 C  SpO2: 99% 99%    Last Pain:  Vitals:   01/02/19 0556  TempSrc: Oral  PainSc:                  Quinta Eimer P Nalla Purdy

## 2019-01-02 NOTE — TOC Transition Note (Signed)
Transition of Care [facility]) - CM/SW Discharge Note   Patient Details  Name: [name] MRN: [id number] Date of Birth: 1933-03-02  [facility]) CM/SW Contact:  Lia Hopping, [place] Phone Number: 01/02/2019, 10:54 AM   Clinical Narrative:    Therapy Plan: [facility]  Has DME   Final next level of care: Home w Home Health Services Barriers to Discharge: No Barriers Identified   Patient Goals and CMS Choice   CMS Medicare.gov Compare Post Acute Care list provided to:: Patient Choice offered to / list presented to : NA(Choice given in Orthopedic Office)  Discharge Placement                       Discharge Plan and Services                DME Arranged: (Has DME)         HH Arranged: PT HH Agency: [facility] (formerly [employer]) Date [place]: 01/02/19   Representative spoke with at [place]: [place] Interventions     Readmission Risk Interventions No flowsheet data found.

## 2019-01-02 NOTE — Plan of Care (Signed)
Patient discharged home in stable condition 

## 2019-01-02 NOTE — Discharge Summary (Signed)
Discharge Summary  Patient ID: ADRAYA HOWTON MRN: [id number] DOB/AGE: [birth year] 85 y.o.  Admit date: 01/01/2019 Discharge date: 01/02/2019  Admission Diagnoses:  Primary osteoarthritis of right hip  Discharge Diagnoses:  Principal Problem:   Primary osteoarthritis of right hip Active Problems:   Hypothyroidism   HYPERCHOLESTEROLEMIA   Essential hypertension   Primary localized osteoarthritis of hip   Past Medical History:  Diagnosis Date  . Anemia    hx -2013 blood transfusion  polyps  . Arthritis   . Coarse tremors    Bilateral Hands and arms  . History of blood transfusion 2013  . Hyperlipemia   . Hypertension   . Hypothyroidism   . Nocturia   . PONV (postoperative nausea and vomiting)     Surgeries: Procedure(s): TOTAL HIP ARTHROPLASTY ANTERIOR APPROACH on 01/01/2019   Consultants (if any):   Discharged Condition: Improved  Hospital Course: [name] is an 85 y.o. female who was admitted 01/01/2019 with a diagnosis of Primary osteoarthritis of right hip and went to the operating room on 01/01/2019 and underwent the above named procedures.    She was given perioperative antibiotics:  Anti-infectives (From admission, onward)   Start     Dose/Rate Route Frequency Ordered Stop   01/01/19 1745  ceFAZolin (ANCEF) IVPB 1 g/50 mL premix     1 g 100 mL/hr over 30 Minutes Intravenous Every 6 hours 01/01/19 1733 01/02/19 0006   01/01/19 1115  ceFAZolin (ANCEF) IVPB 2g/100 mL premix     2 g 200 mL/hr over 30 Minutes Intravenous On call to O.R. 01/01/19 1106 01/01/19 1347    .  She was given sequential compression devices, early ambulation, and aspirin for DVT prophylaxis.  She benefited maximally from the hospital stay and there were no complications.    Recent vital signs:  Vitals:   01/02/19 0124 01/02/19 0556  BP: 124/67 (!) 146/71  Pulse: 62 65  Resp: 17 18  Temp: 98.6 F (37 C) 98.7 F (37.1 C)  SpO2: 99% 99%    Recent  laboratory studies:  Lab Results  Component Value Date   HGB 14.6 12/25/2018   HGB 13.5 12/09/2016   HGB 9.7 (L) 09/26/2014   Lab Results  Component Value Date   WBC 7.9 12/25/2018   PLT 240 12/25/2018   Lab Results  Component Value Date   INR 1.06 09/12/2014   Lab Results  Component Value Date   NA 142 12/25/2018   K 4.4 12/25/2018   CL 108 12/25/2018   CO2 25 12/25/2018   BUN 16 12/25/2018   CREATININE 0.91 12/25/2018   GLUCOSE 108 (H) 12/25/2018    Discharge Medications:   Allergies as of 01/02/2019   No Known Allergies     Medication List    STOP taking these medications   HYDROcodone-acetaminophen 7.5-325 MG tablet Commonly known as: Norco Replaced by: HYDROcodone-acetaminophen 5-325 MG tablet     TAKE these medications   aspirin EC 81 MG tablet Take 1 tablet (81 mg total) by mouth 2 (two) times daily. For DVT prophylaxis for 30 days after surgery. What changed:   when to take this  additional instructions   baclofen 10 MG tablet Commonly known as: LIORESAL Take 1 tablet (10 mg total) by mouth 2 (two) times daily.   celecoxib 100 MG capsule Commonly known as: CeleBREX Take 1 capsule (100 mg total) by mouth 2 (two) times daily for 14 days.   cholecalciferol 25 MCG (1000 UT) tablet Commonly known as:  VITAMIN D Take 1,000 Units by mouth daily.   Fish Oil 1200 MG Caps Take 1,200 mg by mouth daily.   HYDROcodone-acetaminophen 5-325 MG tablet Commonly known as: Norco Take 1-2 tablets by mouth every 6 (six) hours as needed for severe pain. Replaces: HYDROcodone-acetaminophen 7.5-325 MG tablet   levothyroxine 125 MCG tablet Commonly known as: SYNTHROID Take 125 mcg by mouth daily before breakfast.   multivitamin with minerals Tabs tablet Take 1 tablet by mouth daily.   omeprazole 20 MG capsule Commonly known as: PriLOSEC Take 1 capsule (20 mg total) by mouth daily. 30 days for gastroprotection while taking NSAIDs.   ondansetron 4 MG  tablet Commonly known as: Zofran Take 1 tablet (4 mg total) by mouth every 8 (eight) hours as needed for nausea or vomiting.   Phillips Stool Softener 100 MG capsule Generic drug: docusate sodium Take 100 mg by mouth 2 (two) times daily.   rosuvastatin 10 MG tablet Commonly known as: CRESTOR Take 10 mg by mouth daily.   SYSTANE OP Place 1 drop into both eyes 2 (two) times daily.       Diagnostic Studies: DG C-Arm 1-60 Min-No Report  Result Date: 01/01/2019 Fluoroscopy was utilized by the requesting physician.  No radiographic interpretation.   DG HIP OPERATIVE UNILAT WITH PELVIS RIGHT  Result Date: 01/01/2019 CLINICAL DATA:  Status post right hip replacement. EXAM: OPERATIVE right HIP (WITH PELVIS IF PERFORMED) 3 VIEWS TECHNIQUE: Fluoroscopic spot image(s) were submitted for interpretation post-operatively. FLUOROSCOPY TIME:  10 seconds. COMPARISON:  July 22, 2010. FINDINGS: Three intraoperative fluoroscopic images were obtained of the right hip. These demonstrate the right acetabular and femoral components to be well situated. IMPRESSION: Status post right total hip arthroplasty. Electronically Signed   By: Marijo Conception M.D.   On: 01/01/2019 14:58    Disposition: Discharge disposition: 01-Home or Self Care       Discharge Instructions    Discharge patient   Complete by: As directed    Okay to Discharge IF: Cleared by PT, pain & nausea controlled PO, ambulating and tolerating diet.   Discharge disposition: 01-Home or Self Care   Discharge patient date: 01/02/2019      Follow-up Information    Renette Butters, MD.   Specialty: Orthopedic Surgery Contact information: [address] [place] [id number] [phone]            Signed: Prudencio Burly III PA-C 01/02/2019, 7:27 AM

## 2019-01-02 NOTE — Progress Notes (Signed)
Physical Therapy Treatment Patient Details Name: [name] MRN: [id number] DOB: Jan 24, 1933 Today's Date: 01/02/2019    History of Present Illness Pt s/p R DATHA 01/01/2019. and history of lumbar back surgery 2014 and RTKA in 2016, and LTHA ( date not specified) .    PT Comments    POD # 1 am session Pt OOB in recliner.  Assisted with gait and stair training.  General Gait Details: no VC's need during gait.  Pt has had prior joint replacement surgeries and knowledgable with walker.  General stair comments: one VC on proper sequencing and safety.  Tolerated well. Then returned to room to perform some TE's following HEP handout.  Instructed on proper tech, freq as well as use of ICE.   Addressed all mobility questions, discussed appropriate activity, educated on use of ICE.  Pt ready for D/C to home.   Follow Up Recommendations  Follow surgeon's recommendation for DC plan and follow-up therapies     Equipment Recommendations  None recommended by PT    Recommendations for Other Services       Precautions / Restrictions Precautions Precautions: None Restrictions Weight Bearing Restrictions: No Other Position/Activity Restrictions: WBAT    Mobility  Bed Mobility               General bed mobility comments: OOB in recliner  Transfers Overall transfer level: Needs assistance Equipment used: Rolling walker (2 wheeled) Transfers: Sit to/from Stand;Stand Pivot Transfers Sit to Stand: Supervision;Min guard Stand pivot transfers: Supervision;Min guard       General transfer comment: one initial VC on proper hand placement  Ambulation/Gait Ambulation/Gait assistance: Supervision;Min guard Gait Distance (Feet): 115 Feet Assistive device: Rolling walker (2 wheeled) Gait Pattern/deviations: Step-to pattern;Decreased stance time - right Gait velocity: decreased   General Gait Details: no VC's need during gait.  Pt has had prior joint replacement surgeries and  knowledgable with walker   Stairs Stairs: Yes Stairs assistance: Supervision;Min guard Stair Management: Two rails;Forwards;With walker Number of Stairs: 2 General stair comments: one VC on proper sequencing and safety.  Tolerated well.   Wheelchair Mobility    Modified Rankin (Stroke Patients Only)       Balance                                            Cognition Arousal/Alertness: Awake/alert Behavior During Therapy: WFL for tasks assessed/performed Overall Cognitive Status: Within Functional Limits for tasks assessed                                        Exercises   Total Hip Replacement TE's 10 reps ankle pumps 10 reps knee presses 10 reps heel slides 10 reps SAQ's 10 reps ABD Followed by ICE    General Comments        Pertinent Vitals/Pain Pain Assessment: 0-10 Pain Score: 3  Pain Location: R hip Pain Descriptors / Indicators: Aching;Tender Pain Intervention(s): Monitored during session;Premedicated before session;Repositioned;Ice applied    Home Living                      Prior Function            PT Goals (current goals can now be found in the care plan section) Progress towards PT goals:  Progressing toward goals    Frequency    7X/week      PT Plan Current plan remains appropriate    Co-evaluation              AM-PAC PT "6 Clicks" Mobility   Outcome Measure  Help needed turning from your back to your side while in a flat bed without using bedrails?: None Help needed moving from lying on your back to sitting on the side of a flat bed without using bedrails?: None Help needed moving to and from a bed to a chair (including a wheelchair)?: None Help needed standing up from a chair using your arms (e.g., wheelchair or bedside chair)?: None Help needed to walk in hospital room?: None Help needed climbing 3-5 steps with a railing? : A Little 6 Click Score: 23    End of Session  Equipment Utilized During Treatment: Gait belt Activity Tolerance: Patient tolerated treatment well Patient left: in chair;with chair alarm set;with call bell/phone within reach Nurse Communication: Mobility status(pt ready for D/C after just one session.  Dghtr comming after 2 pm.) PT Visit Diagnosis: Other abnormalities of gait and mobility (R26.89);Difficulty in walking, not elsewhere classified (R26.2)     Time: [id number] PT Time Calculation (min) (ACUTE ONLY): 25 min  Charges:  $Gait Training: 8-22 mins $Therapeutic Exercise: 8-22 mins                     Rica Koyanagi  PTA Acute  Rehabilitation Services Pager      [phone] Office      [phone]

## 2019-01-02 NOTE — Progress Notes (Signed)
    Subjective: Patient reports pain as mild to moderate, controlled.  Tolerating diet.  Urinating.   No CP, SOB.  Not yet mobilized with therapy.  Objective:   VITALS:   Vitals:   01/01/19 1944 01/01/19 2059 01/02/19 0124 01/02/19 0556  BP: (!) 172/87 139/66 124/67 (!) 146/71  Pulse:  64 62 65  Resp:  16 17 18   Temp:  98.4 F (36.9 C) 98.6 F (37 C) 98.7 F (37.1 C)  TempSrc:  Oral Oral Oral  SpO2:   99% 99%  Weight:      Height:       CBC Latest Ref Rng & Units 12/25/2018 12/09/2016 09/26/2014  WBC 4.0 - 10.5 K/uL 7.9 6.2 12.0(H)  Hemoglobin 12.0 - 15.0 g/dL 14.6 13.5 9.7(L)  Hematocrit 36.0 - 46.0 % 45.0 40.7 28.6(L)  Platelets 150 - 400 K/uL 240 164 127(L)   BMP Latest Ref Rng & Units 12/25/2018 12/09/2016 09/26/2014  Glucose 70 - 99 mg/dL 108(H) 117(H) 140(H)  BUN 8 - 23 mg/dL 16 15 13   Creatinine 0.44 - 1.00 mg/dL 0.91 1.13(H) 0.94  Sodium 135 - 145 mmol/L 142 137 137  Potassium 3.5 - 5.1 mmol/L 4.4 3.7 4.0  Chloride 98 - 111 mmol/L 108 104 107  CO2 22 - 32 mmol/L 25 24 25   Calcium 8.9 - 10.3 mg/dL 9.8 9.5 9.0   Intake/Output      12/15 0701 - 12/16 0700 12/16 0701 - 12/17 0700   P.O. 260    I.V. (mL/kg) 2206.4 (30.4)    IV Piggyback 50    Total Intake(mL/kg) 2516.4 (34.7)    Urine (mL/kg/hr) 950    Blood 250    Total Output 1200    Net +1316.4            Physical Exam: General: NAD.  Supine in bed.  Calm, conversant. Resp: No increased wob Cardio: regular rate ABD soft Neurologically intact MSK RLE: Neurovascularly intact Sensation intact distally Feet warm Dorsiflexion/Plantar flexion intact Incision: dressing C/D/I   Assessment: 1 Day Post-Op  S/P Procedure(s) (LRB): TOTAL HIP ARTHROPLASTY ANTERIOR APPROACH (Right) by Dr. Ernesta Amble. Percell Miller on 01/01/2019  Principal Problem:   Primary osteoarthritis of right hip Active Problems:   Hypothyroidism   HYPERCHOLESTEROLEMIA   Essential hypertension   Primary localized osteoarthritis of  hip   Primary osteoarthritis, status post total hip arthroplasty Doing well postop day 1 Tolerating diet and voiding Pain controlled Awaiting mobilization with therapy  Plan: Up with therapy Incentive Spirometry Apply ice  Weight Bearing: Weight Bearing as Tolerated (WBAT) RLE Dressings: Maintain Mepilex.   VTE prophylaxis: Aspirin, SCDs, ambulation  Dispo: Home today pending therapy evaluations   Paula Burly III, PA-C 01/02/2019, 7:24 AM

## 2019-03-23 ENCOUNTER — Emergency Department (HOSPITAL_COMMUNITY)
Admission: EM | Admit: 2019-03-23 | Discharge: 2019-03-24 | Disposition: A | Discharge status: Home / Self Care | Payer: Medicare Other | Attending: Emergency Medicine | Admitting: Emergency Medicine

## 2019-03-23 ENCOUNTER — Other Ambulatory Visit: Payer: Self-pay

## 2019-03-23 ENCOUNTER — Emergency Department (HOSPITAL_COMMUNITY): Payer: Medicare Other

## 2019-03-23 ENCOUNTER — Encounter (HOSPITAL_COMMUNITY): Payer: Self-pay | Admitting: Emergency Medicine

## 2019-03-23 DIAGNOSIS — Z7982 Long term (current) use of aspirin: Secondary | ICD-10-CM | POA: Insufficient documentation

## 2019-03-23 DIAGNOSIS — Z79899 Other long term (current) drug therapy: Secondary | ICD-10-CM | POA: Insufficient documentation

## 2019-03-23 DIAGNOSIS — I10 Essential (primary) hypertension: Secondary | ICD-10-CM

## 2019-03-23 DIAGNOSIS — Z96651 Presence of right artificial knee joint: Secondary | ICD-10-CM | POA: Diagnosis not present

## 2019-03-23 DIAGNOSIS — Z96643 Presence of artificial hip joint, bilateral: Secondary | ICD-10-CM | POA: Diagnosis not present

## 2019-03-23 DIAGNOSIS — E039 Hypothyroidism, unspecified: Secondary | ICD-10-CM | POA: Insufficient documentation

## 2019-03-23 DIAGNOSIS — R3129 Other microscopic hematuria: Secondary | ICD-10-CM

## 2019-03-23 MED ORDER — HYDROCODONE-ACETAMINOPHEN 5-325 MG PO TABS
1.0000 | ORAL_TABLET | Freq: Once | ORAL | Status: AC
  Administered 2019-03-24: 1 via ORAL
  Filled 2019-03-23: qty 1

## 2019-03-23 NOTE — ED Triage Notes (Signed)
Patient is here for hypertension and lower back pain. BP currently is 196/90. Patient's daughter is here with her and states that patient can not answer questions for herself. Daughter is patient's medical POA.

## 2019-03-24 LAB — URINALYSIS, ROUTINE W REFLEX MICROSCOPIC
Bacteria, UA: NONE SEEN
Bilirubin Urine: NEGATIVE
Glucose, UA: NEGATIVE mg/dL
Ketones, ur: NEGATIVE mg/dL
Leukocytes,Ua: NEGATIVE
Nitrite: NEGATIVE
Protein, ur: NEGATIVE mg/dL
RBC / HPF: >50 RBC/hpf — ABNORMAL HIGH (ref 0–5)
Specific Gravity, Urine: 1.011 (ref 1.005–1.030)
pH: 7 (ref 5.0–8.0)

## 2019-03-24 LAB — COMPREHENSIVE METABOLIC PANEL
ALT: 21 U/L (ref 0–44)
AST: 23 U/L (ref 15–41)
Albumin: 4.1 g/dL (ref 3.5–5.0)
Alkaline Phosphatase: 61 U/L (ref 38–126)
Anion gap: 10 (ref 5–15)
BUN: 12 mg/dL (ref 8–23)
CO2: 26 mmol/L (ref 22–32)
Calcium: 9.7 mg/dL (ref 8.9–10.3)
Chloride: 106 mmol/L (ref 98–111)
Creatinine, Ser: 0.9 mg/dL (ref 0.44–1.00)
GFR calc Af Amer: >60 mL/min (ref >60)
GFR calc non Af Amer: 59 mL/min — ABNORMAL LOW (ref >60)
Glucose, Bld: 124 mg/dL — ABNORMAL HIGH (ref 70–99)
Potassium: 3.7 mmol/L (ref 3.5–5.1)
Sodium: 142 mmol/L (ref 135–145)
Total Bilirubin: 0.9 mg/dL (ref 0.3–1.2)
Total Protein: 7 g/dL (ref 6.5–8.1)

## 2019-03-24 LAB — CBC WITH DIFFERENTIAL/PLATELET
Abs Immature Granulocytes: 0.02 10*3/uL (ref 0.00–0.07)
Basophils Absolute: 0.1 10*3/uL (ref 0.0–0.1)
Basophils Relative: 1 %
Eosinophils Absolute: 0.2 10*3/uL (ref 0.0–0.5)
Eosinophils Relative: 3 %
HCT: 43.7 % (ref 36.0–46.0)
Hemoglobin: 14.1 g/dL (ref 12.0–15.0)
Immature Granulocytes: 0 %
Lymphocytes Relative: 27 %
Lymphs Abs: 2 10*3/uL (ref 0.7–4.0)
MCH: 32.3 pg (ref 26.0–34.0)
MCHC: 32.3 g/dL (ref 30.0–36.0)
MCV: 100.2 fL — ABNORMAL HIGH (ref 80.0–100.0)
Monocytes Absolute: 0.7 10*3/uL (ref 0.1–1.0)
Monocytes Relative: 10 %
Neutro Abs: 4.4 10*3/uL (ref 1.7–7.7)
Neutrophils Relative %: 59 %
Platelets: 168 10*3/uL (ref 150–400)
RBC: 4.36 MIL/uL (ref 3.87–5.11)
RDW: 12.4 % (ref 11.5–15.5)
WBC: 7.4 10*3/uL (ref 4.0–10.5)
nRBC: 0 % (ref 0.0–0.2)

## 2019-03-24 MED ORDER — METOPROLOL TARTRATE 25 MG PO TABS: 25.0000 mg | ORAL_TABLET | Freq: Two times a day (BID) | ORAL | 0 refills | Status: DC

## 2019-03-24 MED ORDER — METOPROLOL TARTRATE 25 MG PO TABS
25.0000 mg | ORAL_TABLET | Freq: Once | ORAL | Status: AC
  Administered 2019-03-24: 25 mg via ORAL
  Filled 2019-03-24: qty 1

## 2019-03-24 NOTE — ED Provider Notes (Signed)
[facility] EMERGENCY DEPARTMENT Provider Note   CSN: [id number] Arrival date & time: 03/23/19  2048     History Chief Complaint  Patient presents with  . Hypertension    back pain    [name] is a 86 y.o. female.  Patient brought in by daughter for concerns for high blood pressure.  There is also been some complaint of some back pain without any fall or injury.  Patient's primary care doctor is recently about 3 weeks ago increased her lisinopril currently taking 30 mg.  And they decreased her hydrochlorothiazide from 25 mg to 12.5 mg.  Patient's pressures in the last few days been running in the 1 0000000 systolic.  No severe headache no chest pain no shortness of breath.  There was a some the back pain.  And and the daughter is also concerned that when she gets that it could be a urinary tract infection.  The pain does not appear to be severe.        Past Medical History:  Diagnosis Date  . Anemia    hx -2013 blood transfusion  polyps  . Arthritis   . Coarse tremors    Bilateral Hands and arms  . History of blood transfusion 2013  . Hyperlipemia   . Hypertension   . Hypothyroidism   . Nocturia   . PONV (postoperative nausea and vomiting)     Patient Active Problem List   Diagnosis Date Noted  . Primary localized osteoarthritis of hip 01/01/2019  . Primary osteoarthritis of right hip 12/19/2018  . DJD (degenerative joint disease) of knee 09/24/2014  . COLONIC POLYPS, ADENOMATOUS, HX OF 10/07/2008  . Hypothyroidism 10/06/2008  . HYPERCHOLESTEROLEMIA 10/06/2008  . DEPRESSION 10/06/2008  . Essential hypertension 10/06/2008  . HEMORRHOIDS 10/06/2008  . DIVERTICULAR DISEASE 10/06/2008  . OSTEOPOROSIS 10/06/2008  . DYSPNEA 10/06/2008  . CHEST DISCOMFORT 10/06/2008  . COLONOSCOPY, HX OF 10/06/2008  . ARTHROSCOPY, RIGHT KNEE, HX OF 10/06/2008    Past Surgical History:  Procedure Laterality Date  . COLONOSCOPY    . FRACTURE SURGERY     left leg  rod    . JOINT REPLACEMENT Left    hip  . KNEE ARTHROSCOPY Right   . LUMBAR LAMINECTOMY/DECOMPRESSION MICRODISCECTOMY Right 04/10/2012   Procedure: LUMBAR LAMINECTOMY/DECOMPRESSION MICRODISCECTOMY 1 LEVEL;  Surgeon: Erline Levine, MD;  Location: [place] [facility];  Service: Neurosurgery;  Laterality: Right;  Right Lumbar three - four Microdiskectomy  . TOTAL HIP ARTHROPLASTY Left   . TOTAL HIP ARTHROPLASTY Right 01/01/2019   Procedure: TOTAL HIP ARTHROPLASTY ANTERIOR APPROACH;  Surgeon: Renette Butters, MD;  Location: [facility];  Service: Orthopedics;  Laterality: Right;  . TOTAL KNEE ARTHROPLASTY Right 09/24/2014   Procedure: TOTAL KNEE ARTHROPLASTY;  Surgeon: Ninetta Lights, MD;  Location: [place];  Service: Orthopedics;  Laterality: Right;     OB History   No obstetric history on file.     Family History  Problem Relation Age of Onset  . Stroke Mother     Social History   Tobacco Use  . Smoking status: Never Smoker  . Smokeless tobacco: Never Used  Substance Use Topics  . Alcohol use: No  . Drug use: No    Home Medications Prior to Admission medications   Medication Sig Start Date End Date Taking? Authorizing Provider  baclofen (LIORESAL) 10 MG tablet Take 1 tablet (10 mg total) by mouth 2 (two) times daily. 01/01/19  Yes Prudencio Burly III, PA-C  cholecalciferol (VITAMIN  D) 25 MCG (1000 UT) tablet Take 1,000 Units by mouth daily.   Yes [provider]  hydrochlorothiazide (HYDRODIURIL) 25 MG tablet Take 12.5 mg by mouth every morning. 03/22/19  Yes [provider]  levothyroxine (SYNTHROID, LEVOTHROID) 125 MCG tablet Take 125 mcg by mouth daily before breakfast.    Yes [provider]  lisinopril (ZESTRIL) 10 MG tablet Take 10 mg by mouth daily. 03/15/19  Yes [provider]  Multiple Vitamin (MULTIVITAMIN WITH MINERALS) TABS tablet Take 1 tablet by mouth daily.   Yes [provider]  Omega-3 Fatty Acids (FISH OIL) 1200 MG CAPS Take  1,200 mg by mouth daily.    Yes [provider]  rosuvastatin (CRESTOR) 10 MG tablet Take 10 mg by mouth daily.   Yes [provider]  aspirin EC 81 MG tablet Take 1 tablet (81 mg total) by mouth 2 (two) times daily. For DVT prophylaxis for 30 days after surgery. 01/01/19   Prudencio Burly III, PA-C  metoprolol tartrate (LOPRESSOR) 25 MG tablet Take 1 tablet (25 mg total) by mouth 2 (two) times daily. 03/24/19   Fredia Sorrow, MD    Allergies    Patient has no known allergies.  Review of Systems   Review of Systems  Constitutional: Negative for chills and fever.  HENT: Negative for congestion, rhinorrhea and sore throat.   Eyes: Negative for visual disturbance.  Respiratory: Negative for cough and shortness of breath.   Cardiovascular: Negative for chest pain and leg swelling.  Gastrointestinal: Negative for abdominal pain, diarrhea, nausea and vomiting.  Genitourinary: Negative for dysuria.  Musculoskeletal: Positive for back pain. Negative for neck pain.  Skin: Negative for rash.  Neurological: Negative for dizziness, light-headedness and headaches.  Hematological: Does not bruise/bleed easily.  Psychiatric/Behavioral: Negative for confusion.    Physical Exam Updated Vital Signs BP (!) 190/82   Pulse 64   Temp 98.9 F (37.2 C) (Oral)   Resp 18   Ht 1.727 m (5\' 8" )   Wt 77.1 kg   SpO2 99%   BMI 25.85 kg/m   Physical Exam Vitals and nursing note reviewed.  Constitutional:      General: She is not in acute distress.    Appearance: Normal appearance. She is well-developed.  HENT:     Head: Normocephalic and atraumatic.  Eyes:     Extraocular Movements: Extraocular movements intact.     Conjunctiva/sclera: Conjunctivae normal.     Pupils: Pupils are equal, round, and reactive to light.  Cardiovascular:     Rate and Rhythm: Normal rate and regular rhythm.     Heart sounds: No murmur.  Pulmonary:     Effort: Pulmonary effort is normal. No  respiratory distress.     Breath sounds: Normal breath sounds.  Abdominal:     Palpations: Abdomen is soft.     Tenderness: There is no abdominal tenderness.  Musculoskeletal:        General: No tenderness, deformity or signs of injury.     Cervical back: Normal range of motion and neck supple.     Right lower leg: No edema.     Left lower leg: No edema.     Comments: No back pain or pain with range of motion of the legs.  Skin:    General: Skin is warm and dry.  Neurological:     General: No focal deficit present.     Mental Status: She is alert.     Cranial Nerves: No cranial  nerve deficit.     Sensory: No sensory deficit.     Motor: No weakness.     Comments: Patient has a history of some confusion.  But she is alert and answers questions.     ED Results / Procedures / Treatments   Labs (all labs ordered are listed, but only abnormal results are displayed) Labs Reviewed  CBC WITH DIFFERENTIAL/PLATELET - Abnormal; Notable for the following components:      Result Value   MCV 100.2 (*)    All other components within normal limits  COMPREHENSIVE METABOLIC PANEL - Abnormal; Notable for the following components:   Glucose, Bld 124 (*)    GFR calc non Af Amer 59 (*)    All other components within normal limits  URINALYSIS, ROUTINE W REFLEX MICROSCOPIC    EKG None  Radiology DG Chest 2 View  Result Date: 03/24/2019 CLINICAL DATA:  Hypertension EXAM: CHEST - 2 VIEW COMPARISON:  December 09, 2016 FINDINGS: The heart size is mildly enlarged. Aortic calcifications are noted. The lungs are hyperexpanded. There is no pneumothorax. No large pleural effusion. No focal infiltrate. There are degenerative changes of both glenohumeral joints. There is no acute osseous abnormality. IMPRESSION: Mild cardiomegaly. No acute cardiopulmonary disease. COPD. Electronically Signed   By: Constance Holster M.D.   On: 03/24/2019 00:21   DG Lumbar Spine Complete  Result Date: 03/24/2019 CLINICAL  DATA:  Back pain EXAM: LUMBAR SPINE - COMPLETE 4+ VIEW COMPARISON:  April 10, 2012 FINDINGS: Multilevel degenerative changes are noted throughout the lumbar spine, greatest at the L5-S1 and L2-L3 level. There is no displaced fracture. No malalignment. Aortic calcifications are noted. There is facet arthrosis at the lower lumbar segments. A IMPRESSION: Multilevel degenerative changes of the lumbar spine, greatest at L2-L3 and L5-S1. Electronically Signed   By: Constance Holster M.D.   On: 03/24/2019 00:22    Procedures Procedures (including critical care time)  Medications Ordered in ED Medications  metoprolol tartrate (LOPRESSOR) tablet 25 mg (has no administration in time range)  HYDROcodone-acetaminophen (NORCO/VICODIN) 5-325 MG per tablet 1 tablet (1 tablet Oral Given 03/24/19 0022)    ED Course  I have reviewed the triage vital signs and the nursing notes.  Pertinent labs & imaging results that were available during my care of the patient were reviewed by me and considered in my medical decision making (see chart for details).    MDM Rules/Calculators/A&P                          Patient's blood pressure has climbed appeared about 210 after having some hydrocodone.  X-rays of the back without any specific bony abnormalities.  Chest x-ray negative labs without significant abnormalities.  Urinalysis still pending.  Discussed with Dr. Darrick Meigs the hospitalist.  Recommend starting her on a beta-blocker 25 mg of Lopressor twice a day.  Give first dose here see her blood pressure responds and then reevaluate.  Also patient's urinalysis is still pending.  He also recommends decreasing her lisinopril dose to 20 mg.  Keep in hydrochlorothiazide at the current dose.  And as stated starting Lopressor twice daily.  25 mg  Patient not in any extremitas.  No evidence of any hypertensive emergency. Final Clinical Impression(s) / ED Diagnoses Final diagnoses:  Essential hypertension    Rx / DC  Orders ED Discharge Orders         Ordered    metoprolol tartrate (LOPRESSOR) 25 MG tablet  2 times daily     03/24/19 0053           Fredia Sorrow, MD 03/24/19 [phone]

## 2019-03-24 NOTE — ED Provider Notes (Signed)
Patient left at change of shift to see if her blood pressure would improve with Lopressor 25 mg orally.  I was also to check her urinalysis when it resulted.  2:00 AM patient's blood pressure is 165/65 which is our goal.  We will observe her for another hour to make sure it is not transient.  When I review her urinalysis she has been having microscopic hematuria since 2018.  We discussed that she has had some blood in her urine for the couple of years.  I gave patient and her daughter the option of staying in getting the CT of her abdomen/pelvis because she has not had 1 in the past 10 years in our system.  Daughter informs me they have a follow-up appointment with a urologist soon.  They have elected just to do that.  3:45 AM blood pressure is 152/73, patient states she feels better and feels ready to be discharged.  Daughter was concerned about her having some bradycardia, her heart rate was in the 50s.  We discussed that she was started on a beta-blocker and that could make her heart rate low.  However her blood pressure is good and she should be able to tolerate it.  Vitals:   03/24/19 0200 03/24/19 0230 03/24/19 0300 03/24/19 0330  BP: (!) 165/65 (!) 142/73 135/67 (!) 152/73  Pulse: (!) 54 (!) 53 (!) 51 (!) 51  Temp:      Resp: 16     Height:      Weight:      SpO2: 95% 95% 91% 98%  TempSrc:      BMI (Calculated):         Urinalysis    Component Value Date/Time   COLORURINE YELLOW 03/24/2019 0137   APPEARANCEUR HAZY (A) 03/24/2019 0137   LABSPEC 1.011 03/24/2019 0137   PHURINE 7.0 03/24/2019 0137   GLUCOSEU NEGATIVE 03/24/2019 0137   HGBUR SMALL (A) 03/24/2019 0137   BILIRUBINUR NEGATIVE 03/24/2019 [place] 03/24/2019 0137   PROTEINUR NEGATIVE 03/24/2019 0137   NITRITE NEGATIVE 03/24/2019 0137   LEUKOCYTESUR NEGATIVE 03/24/2019 [id number]  More than 50 RBCs per high-powered field, 0-5 white blood cells per high-powered field, bacteria none seen, squamous cells  0-5  Diagnoses that have been ruled out:  None  Diagnoses that are still under consideration:  None  Final diagnoses:  Essential hypertension  Microscopic hematuria   ED Discharge Orders         Ordered    metoprolol tartrate (LOPRESSOR) 25 MG tablet  2 times daily     03/24/19 0053    metoprolol tartrate (LOPRESSOR) 25 MG tablet  2 times daily     03/24/19 0133         Plan discharge  Rolland Porter, MD, Barbette Or, MD 03/24/19 [phone]

## 2019-03-24 NOTE — Discharge Instructions (Addendum)
Follow-up with the cardiologist for additional blood pressure control.  Keep the blood pressure logs that you been doing.  Internal medicine here recommended dropping the lisinopril to 20 mg a day.  Start taking metoprolol 25 mg twice a day.  New prescription provided for that.  And then keep the hydrochlorothiazide at the current dose.  Keep the appointment with the Urologist to evaluate the blood in your urine.

## 2019-03-25 NOTE — Progress Notes (Signed)
Cardiology Office Note:    Date:  03/25/2019   ID:  [name], DOB [birth year], MRN [id number]  PCP:  Tomasa Hose, NP  Cardiologist:  No primary care provider on file.   Referring MD: Tomasa Hose, NP   No chief complaint on file.   History of Present Illness:    [name] is a 86 y.o. female with a hx of  hyperlipidemia, hypertension, osteoarthritis, and PVC's.  Is a blood pressure concerns.  Running significantly above 150/80 mmHg.  Has been to primary care and the emergency room because of blood pressures recently.  Lisinopril dose has been increased and metoprolol has been added.  She is accompanied by the daughter today.  Past Medical History:  Diagnosis Date  . Anemia    hx -2013 blood transfusion  polyps  . Arthritis   . Coarse tremors    Bilateral Hands and arms  . History of blood transfusion 2013  . Hyperlipemia   . Hypertension   . Hypothyroidism   . Nocturia   . PONV (postoperative nausea and vomiting)     Past Surgical History:  Procedure Laterality Date  . COLONOSCOPY    . FRACTURE SURGERY     left leg  rod  . JOINT REPLACEMENT Left    hip  . KNEE ARTHROSCOPY Right   . LUMBAR LAMINECTOMY/DECOMPRESSION MICRODISCECTOMY Right 04/10/2012   Procedure: LUMBAR LAMINECTOMY/DECOMPRESSION MICRODISCECTOMY 1 LEVEL;  Surgeon: Erline Levine, MD;  Location: [place] [facility];  Service: Neurosurgery;  Laterality: Right;  Right Lumbar three - four Microdiskectomy  . TOTAL HIP ARTHROPLASTY Left   . TOTAL HIP ARTHROPLASTY Right 01/01/2019   Procedure: TOTAL HIP ARTHROPLASTY ANTERIOR APPROACH;  Surgeon: Renette Butters, MD;  Location: [facility];  Service: Orthopedics;  Laterality: Right;  . TOTAL KNEE ARTHROPLASTY Right 09/24/2014   Procedure: TOTAL KNEE ARTHROPLASTY;  Surgeon: Ninetta Lights, MD;  Location: [place];  Service: Orthopedics;  Laterality: Right;    Current Medications: No outpatient medications have been marked as taking for the  03/26/19 encounter (Appointment) with Belva Crome, MD.     Allergies:   Patient has no known allergies.   Social History   Socioeconomic History  . Marital status: Married    Spouse name: Not on file  . Number of children: Not on file  . Years of education: Not on file  . Highest education level: Not on file  Occupational History  . Not on file  Tobacco Use  . Smoking status: Never Smoker  . Smokeless tobacco: Never Used  Substance and Sexual Activity  . Alcohol use: No  . Drug use: No  . Sexual activity: Not on file  Other Topics Concern  . Not on file  Social History Narrative  . Not on file   Social Determinants of Health   Financial Resource Strain:   . Difficulty of Paying Living Expenses: Not on file  Food Insecurity:   . Worried About Charity fundraiser in the Last Year: Not on file  . Ran Out of Food in the Last Year: Not on file  Transportation Needs:   . Lack of Transportation (Medical): Not on file  . Lack of Transportation (Non-Medical): Not on file  Physical Activity:   . Days of Exercise per Week: Not on file  . Minutes of Exercise per Session: Not on file  Stress:   . Feeling of Stress : Not on file  Social Connections:   . Frequency of  Communication with Friends and Family: Not on file  . Frequency of Social Gatherings with Friends and Family: Not on file  . Attends Religious Services: Not on file  . Active Member of Clubs or Organizations: Not on file  . Attends Archivist Meetings: Not on file  . Marital Status: Not on file     Family History: The patient's family history includes Stroke in her mother.  ROS:   Please see the history of present illness.    Some nausea.  Depressed.  All other systems reviewed and are negative.  EKGs/Labs/Other Studies Reviewed:    The following studies were reviewed today: No new data  EKG:  EKG most recently performed in November 2020 revealed nonspecific ST abnormality  Recent  Labs: 03/23/2019: ALT 21; BUN 12; Creatinine, Ser 0.90; Hemoglobin 14.1; Platelets 168; Potassium 3.7; Sodium 142  Recent Lipid Panel No results found for: CHOL, TRIG, HDL, CHOLHDL, VLDL, LDLCALC, LDLDIRECT  Physical Exam:    VS:  There were no vitals taken for this visit.    Wt Readings from Last 3 Encounters:  03/23/19 170 lb (77.1 kg)  01/01/19 160 lb (72.6 kg)  12/25/18 160 lb (72.6 kg)     GEN: Slender. No acute distress HEENT: Normal NECK: No JVD. LYMPHATICS: No lymphadenopathy CARDIAC:  RRR without murmur, gallop, or edema. VASCULAR:  Normal Pulses. No bruits. RESPIRATORY:  Clear to auscultation without rales, wheezing or rhonchi  ABDOMEN: Soft, non-tender, non-distended, No pulsatile mass, MUSCULOSKELETAL: No deformity  SKIN: Warm and dry NEUROLOGIC:  Alert and oriented x 3 PSYCHIATRIC:  Normal affect   ASSESSMENT:    1. Essential hypertension   2. Premature ventricular contractions   3. HYPERCHOLESTEROLEMIA   4. Nonspecific abnormal electrocardiogram (ECG) (EKG)   5. Educated about COVID-19 virus infection    PLAN:    In order of problems listed above:  1. Blood pressure is poorly controlled.  Add amlodipine 2.5 mg/day.  Continue HCTZ 12.5 mg/day, lisinopril 20 mg/day, and metoprolol 25 mg twice daily.  Clinical follow-up in 2 weeks for blood pressure recheck.  Measure blood pressure once per day to 2 4 hours after morning meds. 2. No PVCs have been noted 3. Continue Crestor 10 mg/day for target LDL less than equal to 70 4. EKG is not repeated 5. 3W's and COVID-19 vaccine are endorsed.  2-week follow-up with me, team member, or in blood pressure clinic.  Next move would be to further increase amlodipine.  May be able to get rid of beta-blocker therapy.   Medication Adjustments/Labs and Tests Ordered: Current medicines are reviewed at length with the patient today.  Concerns regarding medicines are outlined above.  No orders of the defined types were placed in  this encounter.  No orders of the defined types were placed in this encounter.   There are no Patient Instructions on file for this visit.   Signed, Sinclair Grooms, MD  03/25/2019 8:53 PM    [place]

## 2019-03-26 ENCOUNTER — Encounter: Payer: Self-pay | Admitting: Interventional Cardiology

## 2019-03-26 ENCOUNTER — Ambulatory Visit: Payer: Medicare Other | Admitting: Interventional Cardiology

## 2019-03-26 ENCOUNTER — Other Ambulatory Visit: Payer: Self-pay

## 2019-03-26 VITALS — BP 144/72 | HR 58 | Ht 68.0 in | Wt 170.0 lb

## 2019-03-26 DIAGNOSIS — E7800 Pure hypercholesterolemia, unspecified: Secondary | ICD-10-CM | POA: Diagnosis not present

## 2019-03-26 DIAGNOSIS — I493 Ventricular premature depolarization: Secondary | ICD-10-CM

## 2019-03-26 DIAGNOSIS — R9431 Abnormal electrocardiogram [ECG] [EKG]: Secondary | ICD-10-CM | POA: Diagnosis not present

## 2019-03-26 DIAGNOSIS — I10 Essential (primary) hypertension: Secondary | ICD-10-CM | POA: Diagnosis not present

## 2019-03-26 DIAGNOSIS — Z7189 Other specified counseling: Secondary | ICD-10-CM

## 2019-03-26 MED ORDER — AMLODIPINE BESYLATE 2.5 MG PO TABS: 2.5000 mg | ORAL_TABLET | Freq: Every day | ORAL | 3 refills | Status: DC

## 2019-03-26 NOTE — Patient Instructions (Signed)
Medication Instructions:  1) START Amlodipine 2.5mg  once daily.  Please monitor your blood pressure daily, at least 2-3 hours after your medication, and bring those readings with you to your follow up visit. Check blood pressure in your left arm.   *If you need a refill on your cardiac medications before your next appointment, please call your pharmacy*   Lab Work: None If you have labs (blood work) drawn today and your tests are completely normal, you will receive your results only by: Marland Kitchen MyChart Message (if you have MyChart) OR . A paper copy in the mail If you have any lab test that is abnormal or we need to change your treatment, we will call you to review the results.   Testing/Procedures: None   Follow-Up: At [facility], you and your health needs are our priority.  As part of our continuing mission to provide you with exceptional heart care, we have created designated Provider Care Teams.  These Care Teams include your primary Cardiologist (physician) and Advanced Practice Providers (APPs -  Physician Assistants and Nurse Practitioners) who all work together to provide you with the care you need, when you need it.  We recommend signing up for the patient portal called "MyChart".  Sign up information is provided on this After Visit Summary.  MyChart is used to connect with patients for Virtual Visits (Telemedicine).  Patients are able to view lab/test results, encounter notes, upcoming appointments, etc.  Non-urgent messages can be sent to your provider as well.   To learn more about what you can do with MyChart, go to [email or web address].    Your next appointment:   2 week(s)  The format for your next appointment:   In Person  Provider:   You may see Sinclair Grooms, MD or one of the following Advanced Practice Providers on your designated Care Team:    Truitt Merle, NP  Cecilie Kicks, NP  Kathyrn Drown, NP    Other Instructions

## 2019-04-03 NOTE — Progress Notes (Signed)
CARDIOLOGY OFFICE NOTE  Date:  04/09/2019    [name] Date of Birth: [birth year] Medical Record [id number]  PCP:  Tomasa Hose, NP  Cardiologist:  Tamala Julian    Chief Complaint  Patient presents with  . Follow-up    Seen for Dr. Tamala Julian    History of Present Illness: [name] is a 86 y.o. female who presents today for a follow up visit. Seen for Dr. Tamala Julian.   She has a history of HLD, HTN, OA and PVCs.   Seen here earlier this month by Dr. Tamala Julian - BP was high - had been to PCP and to the ER. ACE had already been increased and Metoprolol added. Dr Tamala Julian added Norvasc.   The patient does not have symptoms concerning for COVID-19 infection (fever, chills, cough, or new shortness of breath).   Comes in today. Here with her daughter today. Doing ok. More limited by her back - trying to get back to walking now that the weather has improved. Some dizziness first thing in the AM - better after eating. BP has been checked at these times and is basically ok. Getting better BP control over at home here over the last few days. Daughter asking about repeat lab and about combining the ACE with the HCTZ to make things easier. Not really bothered by her PVCs - she does feel them - but no symptoms. No chest pain noted. Seems to know what medicines to take and is doing correctly. She has been doing much better with salt restriction. Did eat a lot of canned soup.   Past Medical History:  Diagnosis Date  . Anemia    hx -2013 blood transfusion  polyps  . Arthritis   . Coarse tremors    Bilateral Hands and arms  . History of blood transfusion 2013  . Hyperlipemia   . Hypertension   . Hypothyroidism   . Nocturia   . PONV (postoperative nausea and vomiting)     Past Surgical History:  Procedure Laterality Date  . COLONOSCOPY    . FRACTURE SURGERY     left leg  rod  . JOINT REPLACEMENT Left    hip  . KNEE ARTHROSCOPY Right   . LUMBAR LAMINECTOMY/DECOMPRESSION  MICRODISCECTOMY Right 04/10/2012   Procedure: LUMBAR LAMINECTOMY/DECOMPRESSION MICRODISCECTOMY 1 LEVEL;  Surgeon: Erline Levine, MD;  Location: [facility];  Service: Neurosurgery;  Laterality: Right;  Right Lumbar three - four Microdiskectomy  . TOTAL HIP ARTHROPLASTY Left   . TOTAL HIP ARTHROPLASTY Right 01/01/2019   Procedure: TOTAL HIP ARTHROPLASTY ANTERIOR APPROACH;  Surgeon: Renette Butters, MD;  Location: [facility];  Service: Orthopedics;  Laterality: Right;  . TOTAL KNEE ARTHROPLASTY Right 09/24/2014   Procedure: TOTAL KNEE ARTHROPLASTY;  Surgeon: Ninetta Lights, MD;  Location: [facility];  Service: Orthopedics;  Laterality: Right;     Medications: Current Meds  Medication Sig  . amLODipine (NORVASC) 2.5 MG tablet Take 1 tablet (2.5 mg total) by mouth daily with lunch.  Marland Kitchen aspirin EC 81 MG tablet Take 81 mg by mouth daily.  . cholecalciferol (VITAMIN D) 25 MCG (1000 UT) tablet Take 1,000 Units by mouth daily.  Marland Kitchen docusate sodium (COLACE) 100 MG capsule Take 100 mg by mouth 2 (two) times daily.  Marland Kitchen HYDROcodone-acetaminophen (NORCO/VICODIN) 5-325 MG tablet Take 1 tablet by mouth every 6 (six) hours as needed for moderate pain.  . hydrOXYzine (ATARAX/VISTARIL) 10 MG tablet Take 10 mg by mouth as needed.  Marland Kitchen  levothyroxine (SYNTHROID, LEVOTHROID) 125 MCG tablet Take 125 mcg by mouth daily before breakfast.   . metoprolol tartrate (LOPRESSOR) 25 MG tablet Take 1 tablet (25 mg total) by mouth 2 (two) times daily.  . Multiple Vitamin (MULTIVITAMIN WITH MINERALS) TABS tablet Take 1 tablet by mouth daily.  . Omega-3 Fatty Acids (FISH OIL) 1200 MG CAPS Take 1,200 mg by mouth daily.   . rosuvastatin (CRESTOR) 10 MG tablet Take 10 mg by mouth daily.  . [DISCONTINUED] amLODipine (NORVASC) 2.5 MG tablet Take 1 tablet (2.5 mg total) by mouth daily.  . [DISCONTINUED] hydrochlorothiazide (HYDRODIURIL) 25 MG tablet Take 12.5 mg by mouth every morning.  . [DISCONTINUED] lisinopril (ZESTRIL) 20 MG tablet Take 20 mg  by mouth daily.     Allergies: No Known Allergies  Social History: The patient  reports that she has never smoked. She has never used smokeless tobacco. She reports that she does not drink alcohol or use drugs.   Family History: The patient's family history includes Stroke in her mother.   Review of Systems: Please see the history of present illness.   All other systems are reviewed and negative.   Physical Exam: VS:  BP 130/80   Pulse 61   Ht 5\' 8"  (1.727 m)   Wt 167 lb 1.9 oz (75.8 kg)   SpO2 98%   BMI 25.41 kg/m  .  BMI Body mass index is 25.41 kg/m.  Wt Readings from Last 3 Encounters:  04/09/19 167 lb 1.9 oz (75.8 kg)  03/26/19 170 lb (77.1 kg)  03/23/19 170 lb (77.1 kg)    General: Alert. Elderly. Alert and in no acute distress.   HEENT: Normal.  Neck: Supple, no JVD, carotid bruits, or masses noted.  Cardiac: Regular rate and rhythm. No murmurs, rubs, or gallops. No edema.  Respiratory:  Lungs are clear to auscultation bilaterally with normal work of breathing.  GI: Soft and nontender.  MS: No deformity or atrophy. Gait and ROM intact.  Skin: Warm and dry. Color is normal.  Neuro:  Strength and sensation are intact and no gross focal deficits noted.  Psych: Alert, appropriate and with normal affect.   LABORATORY DATA:  EKG:  EKG is not ordered today.  Lab Results  Component Value Date   WBC 7.4 03/23/2019   HGB 14.1 03/23/2019   HCT 43.7 03/23/2019   PLT 168 03/23/2019   GLUCOSE 124 (H) 03/23/2019   ALT 21 03/23/2019   AST 23 03/23/2019   NA 142 03/23/2019   K 3.7 03/23/2019   CL 106 03/23/2019   CREATININE 0.90 03/23/2019   BUN 12 03/23/2019   CO2 26 03/23/2019   INR 1.06 09/12/2014       BNP (last 3 results) No results for input(s): BNP in the last 8760 hours.  ProBNP (last 3 results) No results for input(s): PROBNP in the last 8760 hours.   Other Studies Reviewed Today:     ASSESSMENT & PLAN:    1. HTN - BP much better -  some mild dizziness - will try split dosing her medicines. Repeat BP by me is 130/74 - would continue with the current regimen for now. She is doing better with salt restriction. They will continue to monitor.   2. PVCs - not bothered by this by her report - she is on low dose beta blocker. I would favor continuing this.   3. HLD - on statin.   4. Back pain - seems to be her most limiting  factor  5. COVID-19 Education: The signs and symptoms of COVID-19 were discussed with the patient and how to seek care for testing (follow up with PCP or arrange E-visit).  The importance of social distancing, staying at home, hand hygiene and wearing a mask when out in public were discussed today.  Current medicines are reviewed with the patient today.  The patient does not have concerns regarding medicines other than what has been noted above.  The following changes have been made:  See above.  Labs/ tests ordered today include:    Orders Placed This Encounter  Procedures  . Basic metabolic panel     Disposition:   FU with [place] in about 4 months.    Patient is agreeable to this plan and will call if any problems develop in the interim.   SignedTruitt Merle, NP  04/09/2019 11:23 AM  [facility] [address] [place], [place]  [address] Phone: [phone] Fax: [phone]

## 2019-04-08 ENCOUNTER — Telehealth: Payer: Self-pay | Admitting: Interventional Cardiology

## 2019-04-08 NOTE — Telephone Encounter (Signed)
Shirl Brockel, Daughter of the patient called and needs to be present with the patient during all future appointments. She is the patient's POA and answers all of the questions for the Doctor or NP. Please let the daughter know if for any reason this is not allowed

## 2019-04-09 ENCOUNTER — Encounter: Payer: Self-pay | Admitting: Nurse Practitioner

## 2019-04-09 ENCOUNTER — Other Ambulatory Visit: Payer: Self-pay

## 2019-04-09 ENCOUNTER — Ambulatory Visit: Payer: Medicare Other | Admitting: Nurse Practitioner

## 2019-04-09 VITALS — BP 130/80 | HR 61 | Ht 68.0 in | Wt 167.1 lb

## 2019-04-09 DIAGNOSIS — Z7189 Other specified counseling: Secondary | ICD-10-CM

## 2019-04-09 DIAGNOSIS — I10 Essential (primary) hypertension: Secondary | ICD-10-CM | POA: Diagnosis not present

## 2019-04-09 DIAGNOSIS — E7800 Pure hypercholesterolemia, unspecified: Secondary | ICD-10-CM | POA: Diagnosis not present

## 2019-04-09 DIAGNOSIS — I493 Ventricular premature depolarization: Secondary | ICD-10-CM | POA: Diagnosis not present

## 2019-04-09 MED ORDER — AMLODIPINE BESYLATE 2.5 MG PO TABS: 2.5000 mg | ORAL_TABLET | Freq: Every day | ORAL | 3 refills | Status: DC

## 2019-04-09 MED ORDER — LISINOPRIL-HYDROCHLOROTHIAZIDE 20-12.5 MG PO TABS: 1.0000 | ORAL_TABLET | Freq: Every day | ORAL | 3 refills | Status: DC

## 2019-04-09 NOTE — Patient Instructions (Addendum)
After Visit Summary:  We will be checking the following labs today - BMET   Medication Instructions:    Continue with your current medicines. BUT  Lets move the Amlodipine to lunchtime  I have combined your Lisinopril with the HCTZ to one pill - this is at the pharmacy.    If you need a refill on your cardiac medications before your next appointment, please call your pharmacy.     Testing/Procedures To Be Arranged:  N/A  Follow-Up:   See [place] back in about 4 months.     At [facility], you and your health needs are our priority.  As part of our continuing mission to provide you with exceptional heart care, we have created designated Provider Care Teams.  These Care Teams include your primary Cardiologist (physician) and Advanced Practice Providers (APPs -  Physician Assistants and Nurse Practitioners) who all work together to provide you with the care you need, when you need it.  Special Instructions:  . Stay safe, stay home, wash your hands for at least 20 seconds and wear a mask when out in public.  . It was good to talk with you today.    Call the [place] office at [phone] if you have any questions, problems or concerns.

## 2019-04-10 LAB — BASIC METABOLIC PANEL
BUN/Creatinine Ratio: 15 (ref 12–28)
BUN: 18 mg/dL (ref 8–27)
CO2: 24 mmol/L (ref 20–29)
Calcium: 9.8 mg/dL (ref 8.7–10.3)
Chloride: 101 mmol/L (ref 96–106)
Creatinine, Ser: 1.2 mg/dL — ABNORMAL HIGH (ref 0.57–1.00)
GFR calc Af Amer: 48 mL/min/{1.73_m2} — ABNORMAL LOW (ref >59)
GFR calc non Af Amer: 42 mL/min/{1.73_m2} — ABNORMAL LOW (ref >59)
Glucose: 114 mg/dL — ABNORMAL HIGH (ref 65–99)
Potassium: 4.1 mmol/L (ref 3.5–5.2)
Sodium: 139 mmol/L (ref 134–144)

## 2019-04-17 ENCOUNTER — Other Ambulatory Visit: Payer: Self-pay

## 2019-04-17 MED ORDER — METOPROLOL TARTRATE 25 MG PO TABS: 25.0000 mg | ORAL_TABLET | Freq: Two times a day (BID) | ORAL | 11 refills | Status: DC

## 2019-07-30 NOTE — Progress Notes (Signed)
CARDIOLOGY OFFICE NOTE  Date:  08/05/2019    [name] Date of Birth: 1933-11-30 Medical Record #[id number]  PCP:  Tomasa Hose, NP  Cardiologist:  Jennings Books  Chief Complaint  Patient presents with  . Follow-up    Seen for Dr. Tamala Julian    History of Present Illness: [name] is a 86 y.o. female who presents today for a 4 month check. Seen for Dr. Tamala Julian.   She has a history of HLD, HTN, OA and PVCs.   Seen here in early March by Dr. Tamala Julian - BP was high - had been to PCP and to the ER. ACE had already been increased and Metoprolol added. Dr Tamala Julian added Norvasc. I then saw her later in March - doing ok. Wanting to switch over to combo therapy for her ARB/diuretic. Some PVCs - but not bothersome - doing much better with salt restriction. Some dizziness noted and I encouraged her to split up her medicines.   Comes in today. Here with her daughter. Doing ok. Looks like Norvasc was increased in April by PCP. At her June visit - BP was great at 110/70 - may have had some dizziness - there was discussion about cutting back Metoprolol. Just saw PCP about 10 days ago for BP check. BP 142/100 at that visit.  Had her ACE restarted - apparently this and her HCTZ were stopped due to CKD - I cannot tell when this happened. There has been complaints of dizziness and stomach upset. Bp still difficult to control.  Looks to me like her GFR was 44 and then back up to 50. She tells me she is not dizzy. She has less stomach upset - trying to eat more breakfast - her nausea is gone.  She is losing weight. She has had some dry cough. She is not using as much salt. Legs are typically fleshy - some swelling - but weight is down. There is some stress - husband just been found to have multiple lung nodules and awaiting evaluation. There is no chest pain. Breathing is ok. She is pretty sedentary.   Past Medical History:  Diagnosis Date  . Anemia    hx -2013 blood  transfusion  polyps  . Arthritis   . Coarse tremors    Bilateral Hands and arms  . History of blood transfusion 2013  . Hyperlipemia   . Hypertension   . Hypothyroidism   . Nocturia   . PONV (postoperative nausea and vomiting)     Past Surgical History:  Procedure Laterality Date  . COLONOSCOPY    . FRACTURE SURGERY     left leg  rod  . JOINT REPLACEMENT Left    hip  . KNEE ARTHROSCOPY Right   . LUMBAR LAMINECTOMY/DECOMPRESSION MICRODISCECTOMY Right 04/10/2012   Procedure: LUMBAR LAMINECTOMY/DECOMPRESSION MICRODISCECTOMY 1 LEVEL;  Surgeon: Erline Levine, MD;  Location: [facility];  Service: Neurosurgery;  Laterality: Right;  Right Lumbar three - four Microdiskectomy  . TOTAL HIP ARTHROPLASTY Left   . TOTAL HIP ARTHROPLASTY Right 01/01/2019   Procedure: TOTAL HIP ARTHROPLASTY ANTERIOR APPROACH;  Surgeon: Renette Butters, MD;  Location: [facility];  Service: Orthopedics;  Laterality: Right;  . TOTAL KNEE ARTHROPLASTY Right 09/24/2014   Procedure: TOTAL KNEE ARTHROPLASTY;  Surgeon: Ninetta Lights, MD;  Location: [facility];  Service: Orthopedics;  Laterality: Right;     Medications: Current Meds  Medication Sig  . amLODipine (NORVASC) 5 MG tablet Take 5 mg by  mouth daily.  Marland Kitchen aspirin EC 81 MG tablet Take 81 mg by mouth daily.  . cholecalciferol (VITAMIN D) 25 MCG (1000 UT) tablet Take 1,000 Units by mouth daily.  Marland Kitchen docusate sodium (COLACE) 100 MG capsule Take 100 mg by mouth 2 (two) times daily.  Marland Kitchen HYDROcodone-acetaminophen (NORCO/VICODIN) 5-325 MG tablet Take 1 tablet by mouth every 6 (six) hours as needed for moderate pain.  . hydrOXYzine (ATARAX/VISTARIL) 10 MG tablet Take 10 mg by mouth as needed.  Marland Kitchen levothyroxine (SYNTHROID, LEVOTHROID) 125 MCG tablet Take 125 mcg by mouth daily before breakfast.   . metoprolol tartrate (LOPRESSOR) 25 MG tablet Take 1 tablet (25 mg total) by mouth 2 (two) times daily.  . Multiple Vitamin (MULTIVITAMIN WITH MINERALS) TABS tablet Take 1 tablet by  mouth daily.  . Omega-3 Fatty Acids (FISH OIL) 1200 MG CAPS Take 1,200 mg by mouth daily.   . rosuvastatin (CRESTOR) 10 MG tablet Take 10 mg by mouth daily.  . [DISCONTINUED] amLODipine (NORVASC) 2.5 MG tablet Take 1 tablet (2.5 mg total) by mouth daily with lunch.  . [DISCONTINUED] lisinopril (ZESTRIL) 20 MG tablet Take by mouth.  . [DISCONTINUED] lisinopril-hydrochlorothiazide (ZESTORETIC) 20-12.5 MG tablet Take 1 tablet by mouth daily.     Allergies: No Known Allergies  Social History: The patient  reports that she has never smoked. She has never used smokeless tobacco. She reports that she does not drink alcohol and does not use drugs.   Family History: The patient's family history includes Stroke in her mother.   Review of Systems: Please see the history of present illness.   All other systems are reviewed and negative.   Physical Exam: VS:  BP (!) 156/78   Pulse 65   Ht 5\' 8"  (1.727 m)   Wt 162 lb 9.6 oz (73.8 kg)   SpO2 97%   BMI 24.72 kg/m  .  BMI Body mass index is 24.72 kg/m.  Wt Readings from Last 3 Encounters:  08/05/19 162 lb 9.6 oz (73.8 kg)  04/09/19 167 lb 1.9 oz (75.8 kg)  03/26/19 170 lb (77.1 kg)   BP is 150/100 by me.   General: Elderly. Alert and in no acute distress. Her weight is down from 169 from November of 2020.   Cardiac: Regular rate and rhythm. No murmurs, rubs, or gallops. No significant edema. Her legs are fleshy.  Respiratory:  Lungs are clear to auscultation bilaterally with normal work of breathing.  GI: Soft and nontender.  MS: No deformity or atrophy. Gait and ROM intact.  Skin: Warm and dry. Color is normal.  Neuro:  Strength and sensation are intact and no gross focal deficits noted.  Psych: Alert, appropriate and with normal affect.   LABORATORY DATA:  EKG:  EKG is not ordered today.    Lab Results  Component Value Date   WBC 7.4 03/23/2019   HGB 14.1 03/23/2019   HCT 43.7 03/23/2019   PLT 168 03/23/2019   GLUCOSE 114  (H) 04/09/2019   ALT 21 03/23/2019   AST 23 03/23/2019   NA 139 04/09/2019   K 4.1 04/09/2019   CL 101 04/09/2019   CREATININE 1.20 (H) 04/09/2019   BUN 18 04/09/2019   CO2 24 04/09/2019   INR 1.06 09/12/2014       BNP (last 3 results) No results for input(s): BNP in the last 8760 hours.  ProBNP (last 3 results) No results for input(s): PROBNP in the last 8760 hours.   Other Studies Reviewed Today:  ASSESSMENT & PLAN:   1. HTN - lots of recent med changes - BMET today - will add back low dose diuretic and change ACE to ARB and only at half dose - Hyzaar 50/12.5 - only 1/2 (25/6.25) daily. Will try to get her BP around 140 consistently.   2. PVCs - not really endorsed - would stay on beta blocker therapy.   3. HLD - on statin - this may be causing some stomach upset.   4. Back pain - not really endorsed.   5. Situational stress - this may be driving some of the issue.   6. CKD - not really surprising to have this given her age - will need to follow closely - highest creatinine I see was 1.2 - BMET today and on return.   Current medicines are reviewed with the patient today.  The patient does not have concerns regarding medicines other than what has been noted above.  The following changes have been made:  See above.  Labs/ tests ordered today include:    Orders Placed This Encounter  Procedures  . Basic metabolic panel     Disposition:   FU with me in one month - BMET on return.    Patient is agreeable to this plan and will call if any problems develop in the interim.   SignedTruitt Merle, NP  08/05/2019 11:32 AM  [facility] [address] [place], [place]  [address] Phone: [phone] Fax: [phone]

## 2019-08-05 ENCOUNTER — Encounter: Payer: Self-pay | Admitting: Nurse Practitioner

## 2019-08-05 ENCOUNTER — Other Ambulatory Visit: Payer: Self-pay

## 2019-08-05 ENCOUNTER — Ambulatory Visit: Payer: Medicare Other | Admitting: Nurse Practitioner

## 2019-08-05 VITALS — BP 156/78 | HR 65 | Ht 68.0 in | Wt 162.6 lb

## 2019-08-05 DIAGNOSIS — E7800 Pure hypercholesterolemia, unspecified: Secondary | ICD-10-CM

## 2019-08-05 DIAGNOSIS — I493 Ventricular premature depolarization: Secondary | ICD-10-CM

## 2019-08-05 DIAGNOSIS — I10 Essential (primary) hypertension: Secondary | ICD-10-CM | POA: Diagnosis not present

## 2019-08-05 LAB — BASIC METABOLIC PANEL WITH GFR
BUN/Creatinine Ratio: 13 (ref 12–28)
BUN: 12 mg/dL (ref 8–27)
CO2: 22 mmol/L (ref 20–29)
Calcium: 9.5 mg/dL (ref 8.7–10.3)
Chloride: 106 mmol/L (ref 96–106)
Creatinine, Ser: 0.95 mg/dL (ref 0.57–1.00)
GFR calc Af Amer: 64 mL/min/1.73
GFR calc non Af Amer: 55 mL/min/1.73 — ABNORMAL LOW
Glucose: 95 mg/dL (ref 65–99)
Potassium: 4 mmol/L (ref 3.5–5.2)
Sodium: 142 mmol/L (ref 134–144)

## 2019-08-05 MED ORDER — LOSARTAN POTASSIUM-HCTZ 50-12.5 MG PO TABS: 0.5000 | ORAL_TABLET | Freq: Every day | ORAL | 3 refills | Status: DC

## 2019-08-05 NOTE — Patient Instructions (Addendum)
After Visit Summary:  We will be checking the following labs today - BMET   Medication Instructions:    Continue with your current medicines. BUT  STOP the Lisinopril  I am going to start Hyzaar 50-12.5 mg - take only 1/2 a tablet each morning - this will hopefully help your BP come down and your cough stop   If you need a refill on your cardiac medications before your next appointment, please call your pharmacy.     Testing/Procedures To Be Arranged:  N/A  Follow-Up:   See me in a month    At [facility], you and your health needs are our priority.  As part of our continuing mission to provide you with exceptional heart care, we have created designated Provider Care Teams.  These Care Teams include your primary Cardiologist (physician) and Advanced Practice Providers (APPs -  Physician Assistants and Nurse Practitioners) who all work together to provide you with the care you need, when you need it.  Special Instructions:  . Stay safe, wash your hands for at least 20 seconds and wear a mask when needed.  . It was good to talk with you today.  Marland Kitchen Keep trying to restrict your salt.    Call the [place] office at [phone] if you have any questions, problems or concerns.

## 2019-08-26 NOTE — Progress Notes (Deleted)
CARDIOLOGY OFFICE NOTE  Date:  08/26/2019    Starleen Blue Date of Birth: [birth year] Medical Record #[id number]  PCP:  Tomasa Hose, NP  Cardiologist:  Jennings Books   No chief complaint on file.   History of Present Illness: [name] is a 86 y.o. female who presents today for a follow up visit. Seen for Dr. Tamala Julian.   She has a history of HLD, HTN, OA and PVCs.   Seen here in early March by Dr. Tamala Julian - BP was high - had been to PCP and to the ER. ACE hadalready beenincreased and Metoprolol added. Dr Tamala Julian added Norvasc.I then saw her later in March - doing ok. Wanting to switch over to combo therapy for her ARB/diuretic. Some PVCs - but not bothersome - doing much better with salt restriction. Some dizziness noted and I encouraged her to split up her medicines.   Last seen here in July by me - had had several medicine changes - daughter wished to get her back on our regimen.  Looks like Norvasc was increased in April by PCP. At her June visit - BP was great at 110/70 - may have had some dizziness - there was discussion about cutting back Metoprolol. Seen back by PCP about 10 days prior to my last visit for BP check. BP 142/100 at that visit.  Had her ACE restarted - apparently this and her HCTZ were stopped due to CKD - I could not tell when this happened. There were complaints of dizziness and stomach upset. Bp still difficult to control.  Looks to me like her GFR was 44 and then back up to 50. She told me she was not dizzy. She had less stomach upset - trying to eat more breakfast - her nausea was gone.  She was losing weight. She had had some dry cough. She was not using as much salt. Legs are typically fleshy - some swelling - but weight was down. There was some stress - husband just been found to have multiple lung nodules and awaiting evaluation. They wished to go back on the regimen that we had initiated.   Comes in today. Here with   Past  Medical History:  Diagnosis Date  . Anemia    hx -2013 blood transfusion  polyps  . Arthritis   . Coarse tremors    Bilateral Hands and arms  . History of blood transfusion 2013  . Hyperlipemia   . Hypertension   . Hypothyroidism   . Nocturia   . PONV (postoperative nausea and vomiting)     Past Surgical History:  Procedure Laterality Date  . COLONOSCOPY    . FRACTURE SURGERY     left leg  rod  . JOINT REPLACEMENT Left    hip  . KNEE ARTHROSCOPY Right   . LUMBAR LAMINECTOMY/DECOMPRESSION MICRODISCECTOMY Right 04/10/2012   Procedure: LUMBAR LAMINECTOMY/DECOMPRESSION MICRODISCECTOMY 1 LEVEL;  Surgeon: Erline Levine, MD;  Location: [facility];  Service: Neurosurgery;  Laterality: Right;  Right Lumbar three - four Microdiskectomy  . TOTAL HIP ARTHROPLASTY Left   . TOTAL HIP ARTHROPLASTY Right 01/01/2019   Procedure: TOTAL HIP ARTHROPLASTY ANTERIOR APPROACH;  Surgeon: Renette Butters, MD;  Location: [facility];  Service: Orthopedics;  Laterality: Right;  . TOTAL KNEE ARTHROPLASTY Right 09/24/2014   Procedure: TOTAL KNEE ARTHROPLASTY;  Surgeon: Ninetta Lights, MD;  Location: [facility];  Service: Orthopedics;  Laterality: Right;     Medications: No outpatient medications  have been marked as taking for the 09/02/19 encounter (Appointment) with Burtis Junes, NP.     Allergies: No Known Allergies  Social History: The patient  reports that she has never smoked. She has never used smokeless tobacco. She reports that she does not drink alcohol and does not use drugs.   Family History: The patient's ***family history includes Stroke in her mother.   Review of Systems: Please see the history of present illness.   All other systems are reviewed and negative.   Physical Exam: VS:  There were no vitals taken for this visit. Marland Kitchen  BMI There is no height or weight on file to calculate BMI.  Wt Readings from Last 3 Encounters:  08/05/19 162 lb 9.6 oz (73.8 kg)  04/09/19 167 lb 1.9 oz (75.8  kg)  03/26/19 170 lb (77.1 kg)    General: Pleasant. Well developed, well nourished and in no acute distress.   HEENT: Normal.  Neck: Supple, no JVD, carotid bruits, or masses noted.  Cardiac: ***Regular rate and rhythm. No murmurs, rubs, or gallops. No edema.  Respiratory:  Lungs are clear to auscultation bilaterally with normal work of breathing.  GI: Soft and nontender.  MS: No deformity or atrophy. Gait and ROM intact.  Skin: Warm and dry. Color is normal.  Neuro:  Strength and sensation are intact and no gross focal deficits noted.  Psych: Alert, appropriate and with normal affect.   LABORATORY DATA:  EKG:  EKG {ACTION; IS/IS [id number]} ordered today.  Personally reviewed by me. This demonstrates ***.  Lab Results  Component Value Date   WBC 7.4 03/23/2019   HGB 14.1 03/23/2019   HCT 43.7 03/23/2019   PLT 168 03/23/2019   GLUCOSE 95 08/05/2019   ALT 21 03/23/2019   AST 23 03/23/2019   NA 142 08/05/2019   K 4.0 08/05/2019   CL 106 08/05/2019   CREATININE 0.95 08/05/2019   BUN 12 08/05/2019   CO2 22 08/05/2019   INR 1.06 09/12/2014     BNP (last 3 results) No results for input(s): BNP in the last 8760 hours.  ProBNP (last 3 results) No results for input(s): PROBNP in the last 8760 hours.   Other Studies Reviewed Today:   Assessment/Plan:  1. HTN - lots of recent med changes - BMET today - will add back low dose diuretic and change ACE to ARB and only at half dose - Hyzaar 50/12.5 - only 1/2 (25/6.25) daily. Will try to get her BP around 140 consistently.   2. PVCs - not really endorsed - would stay on beta blocker therapy.   3. HLD - on statin - this may be causing some stomach upset.   4. Back pain - not really endorsed.   5. Situational stress - this may be driving some of the issue.   6. CKD - not really surprising to have this given her age - will need to follow closely - highest creatinine I see was 1.2 - BMET today and on return.    Current medicines are reviewed with the patient today.  The patient does not have concerns regarding medicines other than what has been noted above.  The following changes have been made:  See above.  Labs/ tests ordered today include:   No orders of the defined types were placed in this encounter.    Disposition:   FU with *** in {gen number [id number]} {Days to years:10300}.   Patient is agreeable to this plan and will  call if any problems develop in the interim.   SignedTruitt Merle, NP  08/26/2019 7:33 AM  [facility] [address] [place], [place]  [address] Phone: [phone] Fax: [phone]

## 2019-09-02 ENCOUNTER — Ambulatory Visit: Payer: Medicare Other | Admitting: Nurse Practitioner

## 2020-04-22 ENCOUNTER — Other Ambulatory Visit (HOSPITAL_COMMUNITY): Payer: Self-pay | Admitting: Nurse Practitioner

## 2020-04-22 ENCOUNTER — Other Ambulatory Visit (HOSPITAL_COMMUNITY): Payer: Self-pay | Admitting: Internal Medicine

## 2020-04-22 DIAGNOSIS — E2839 Other primary ovarian failure: Secondary | ICD-10-CM

## 2020-04-28 ENCOUNTER — Other Ambulatory Visit: Payer: Self-pay

## 2020-04-28 ENCOUNTER — Ambulatory Visit (HOSPITAL_COMMUNITY)
Admission: RE | Admit: 2020-04-28 | Discharge: 2020-04-28 | Disposition: A | Discharge status: Home / Self Care | Payer: Medicare Other | Source: Ambulatory Visit | Attending: Nurse Practitioner | Admitting: Nurse Practitioner

## 2020-04-28 DIAGNOSIS — E2839 Other primary ovarian failure: Secondary | ICD-10-CM | POA: Insufficient documentation

## 2020-06-04 IMAGING — RF DG C-ARM 1-60 MIN-NO REPORT
1 series · 3 of 3 positions shown · non-contrast
Comparison: July 22, 2010.

CLINICAL DATA: Status post right hip replacement.

EXAM:
OPERATIVE right HIP (WITH PELVIS IF PERFORMED) 3 VIEWS
TECHNIQUE: Fluoroscopic spot image(s) were submitted for interpretation
post-operatively.
FLUOROSCOPY TIME:  10 seconds.

[Series 1: unknown protocol · 0.20mm/px · 3 of 3 slices shown]
[im 1/3]
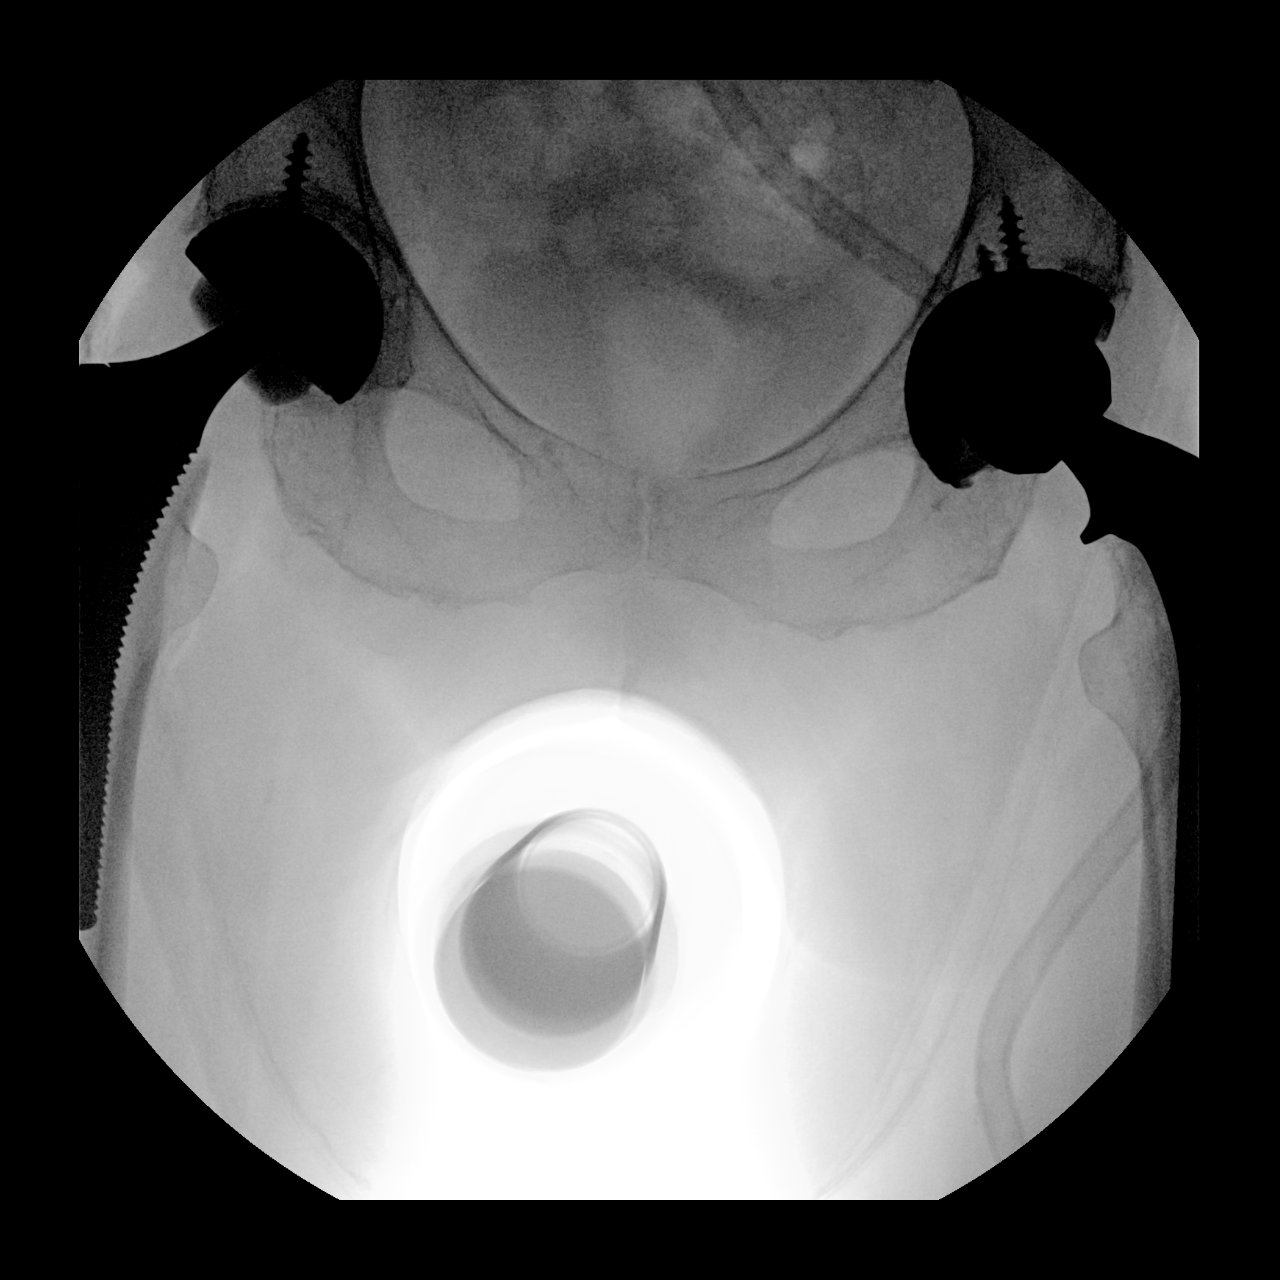
[im 2/3]
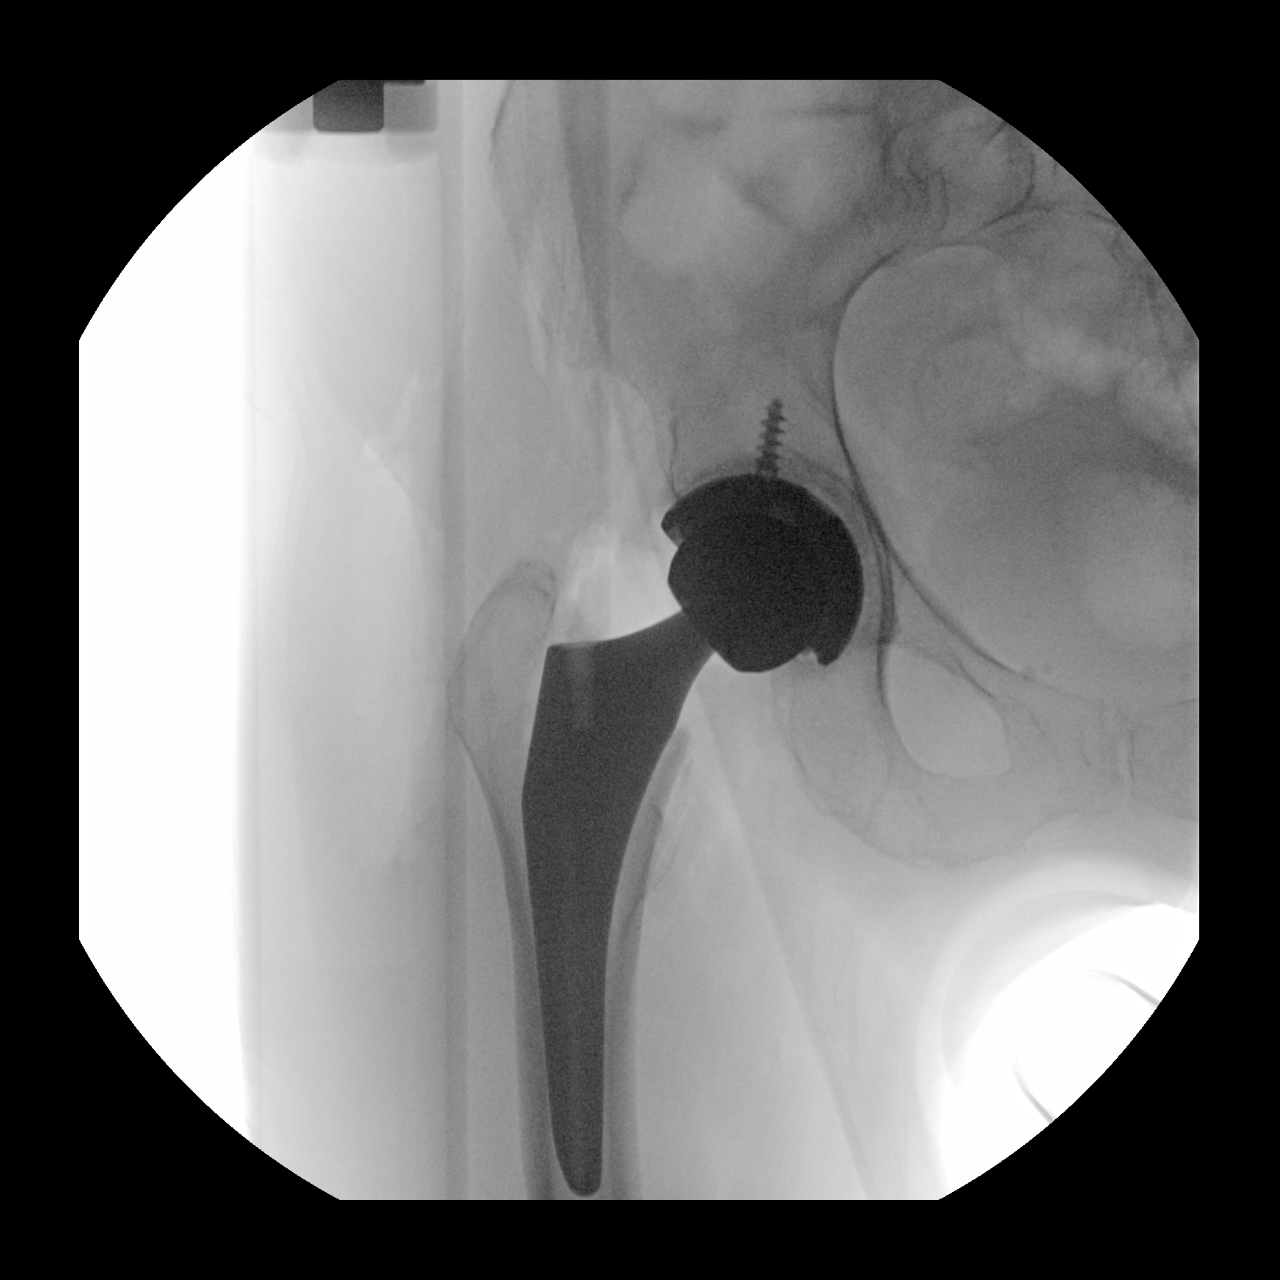
[im 3/3]
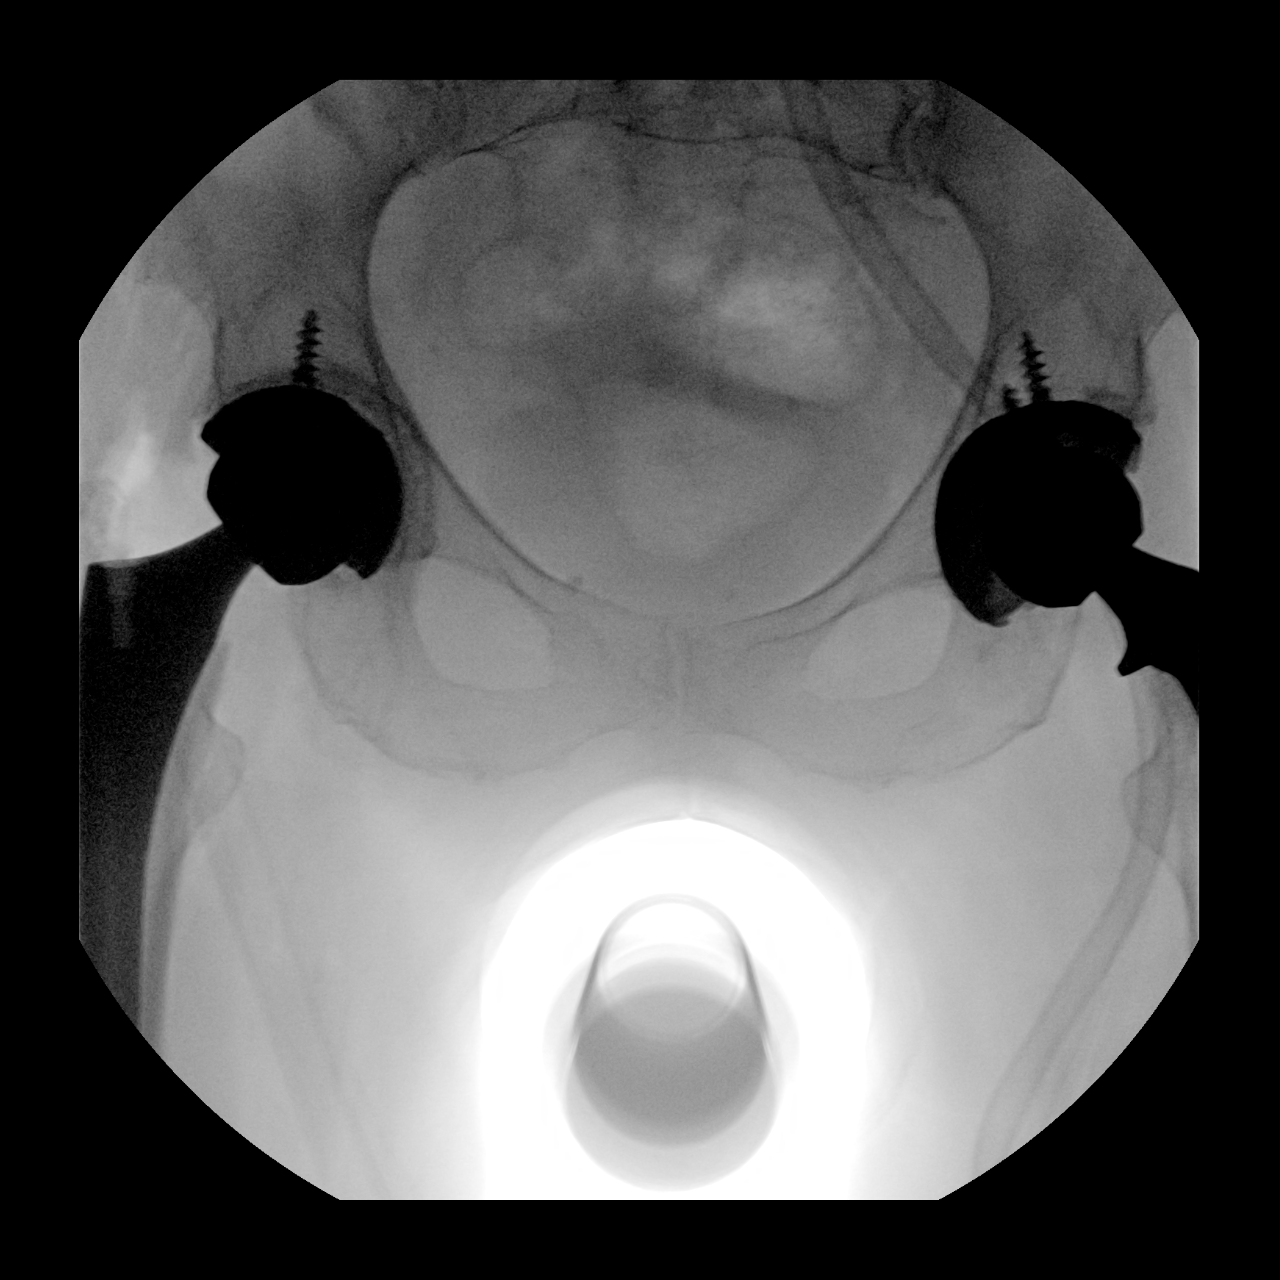

[3 of 3 positions shown; findings below may reference images not displayed]

FINDINGS: Three intraoperative fluoroscopic images were obtained of the right
hip. These demonstrate the right acetabular and femoral components
to be well situated.
IMPRESSION: Status post right total hip arthroplasty.

## 2020-06-04 IMAGING — RF DG HIP (WITH PELVIS) OPERATIVE*R*
1 series · 3 of 3 positions shown · non-contrast
Comparison: July 22, 2010.

CLINICAL DATA: Status post right hip replacement.

EXAM:
OPERATIVE right HIP (WITH PELVIS IF PERFORMED) 3 VIEWS
TECHNIQUE: Fluoroscopic spot image(s) were submitted for interpretation
post-operatively.
FLUOROSCOPY TIME:  10 seconds.

[Series 1: unknown protocol · 0.20mm/px · 3 of 3 slices shown]
[im 1/3]
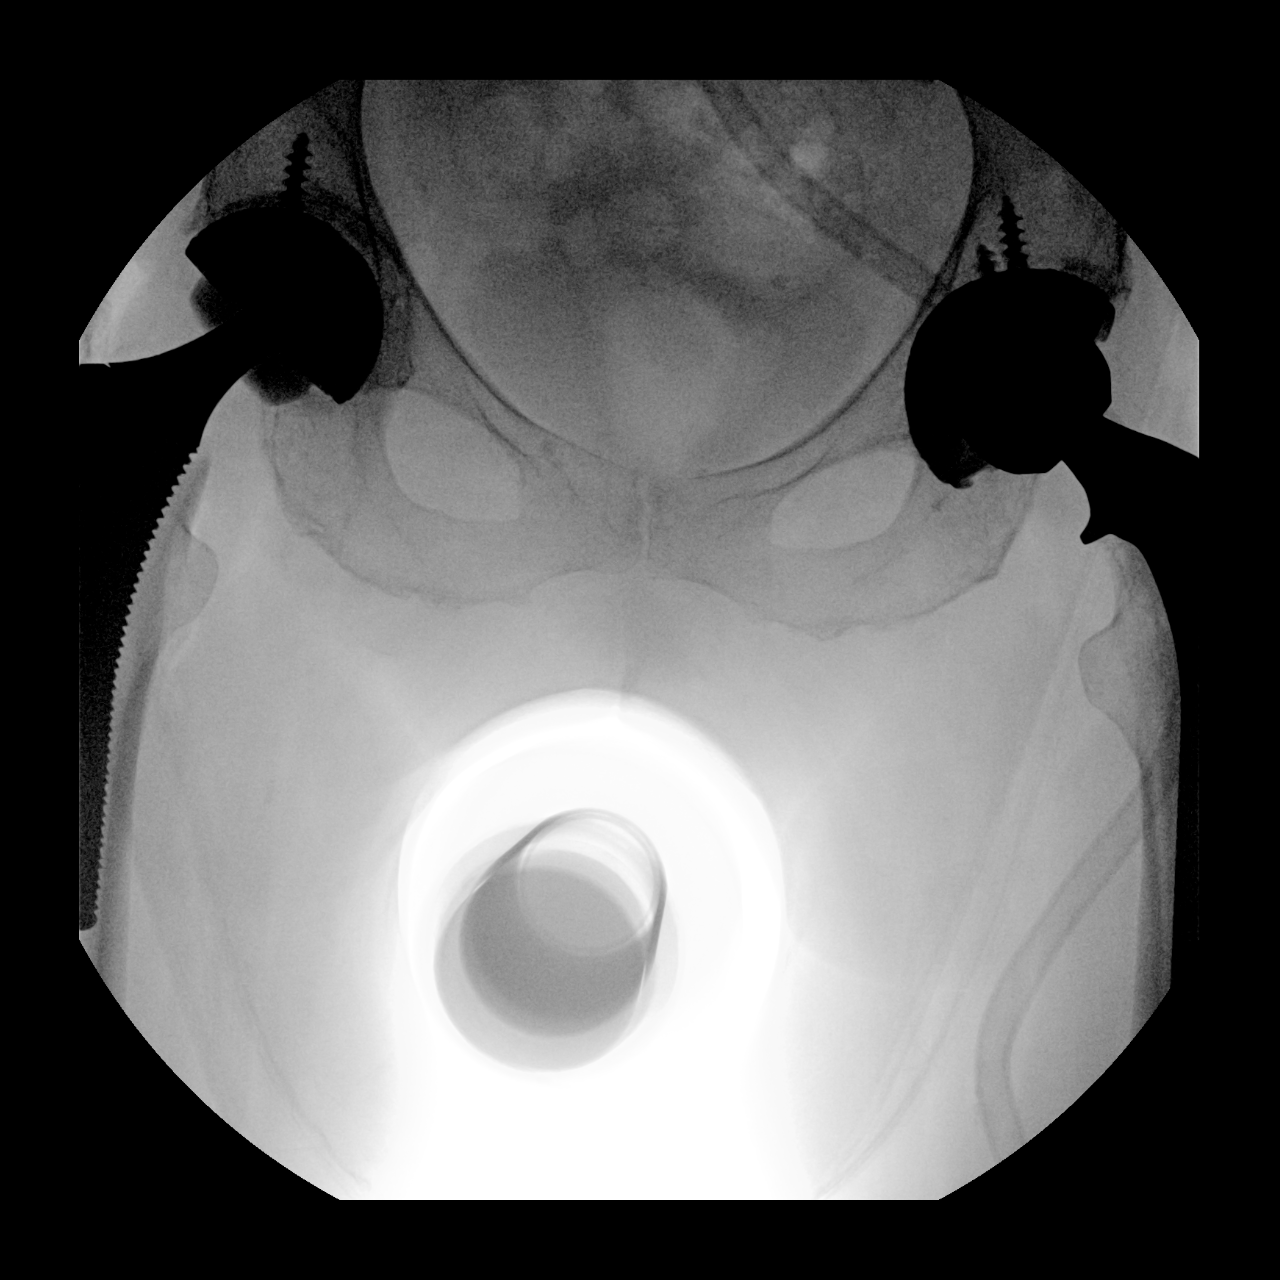
[im 2/3]
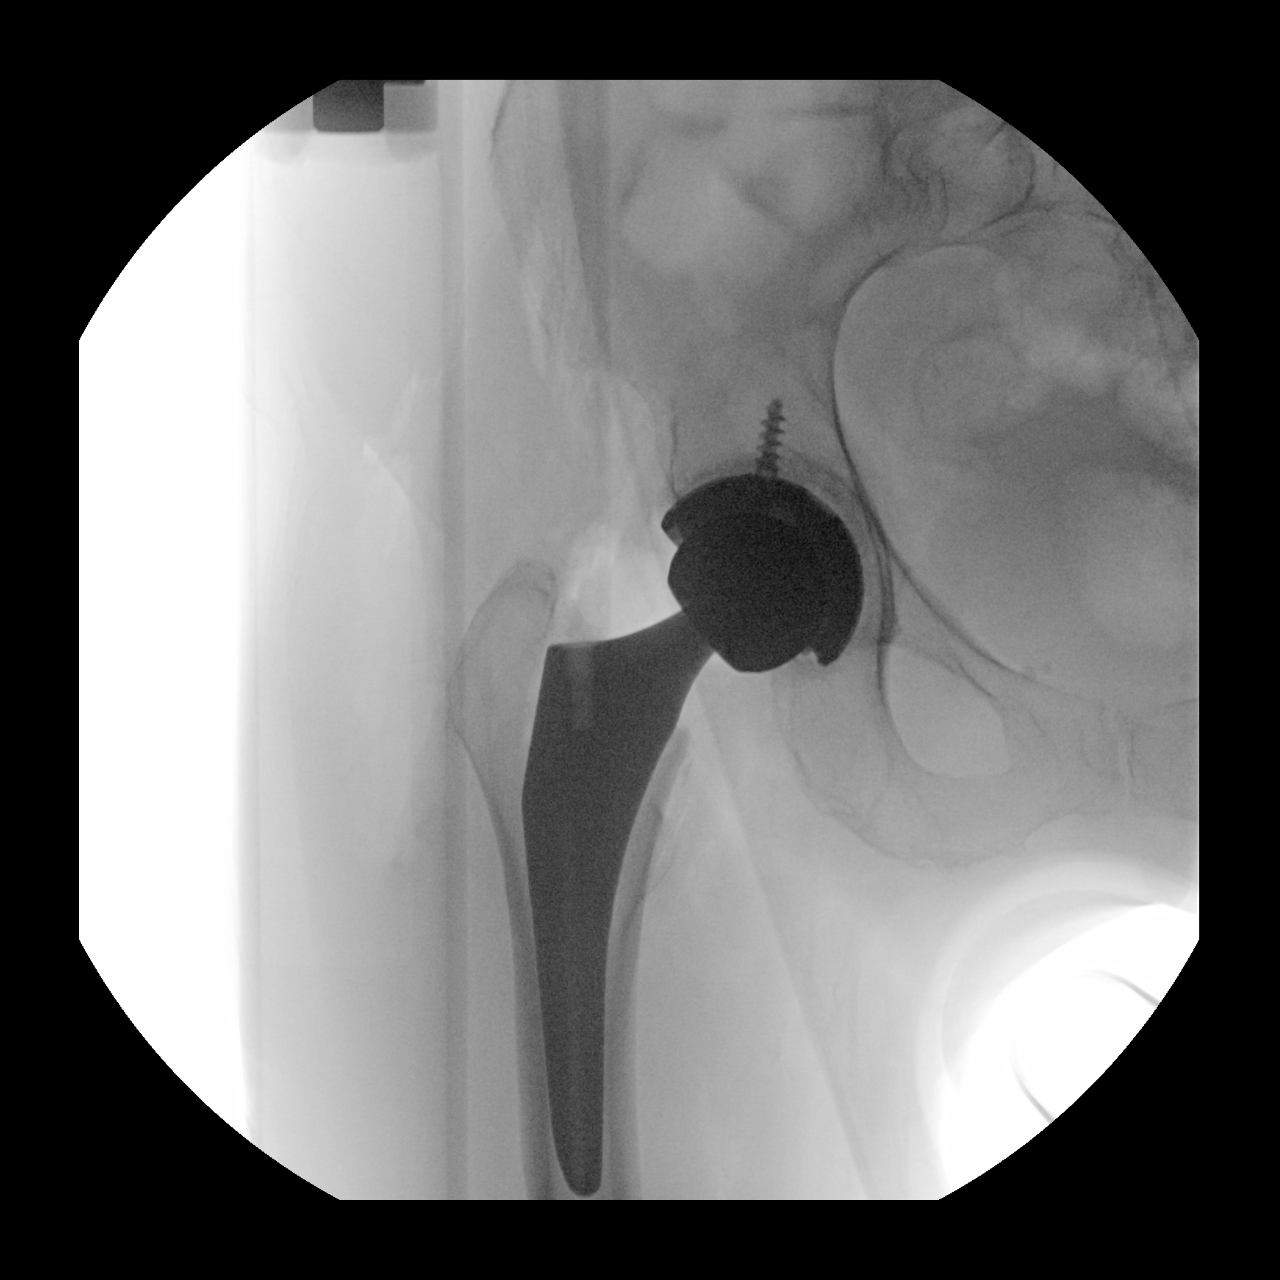
[im 3/3]
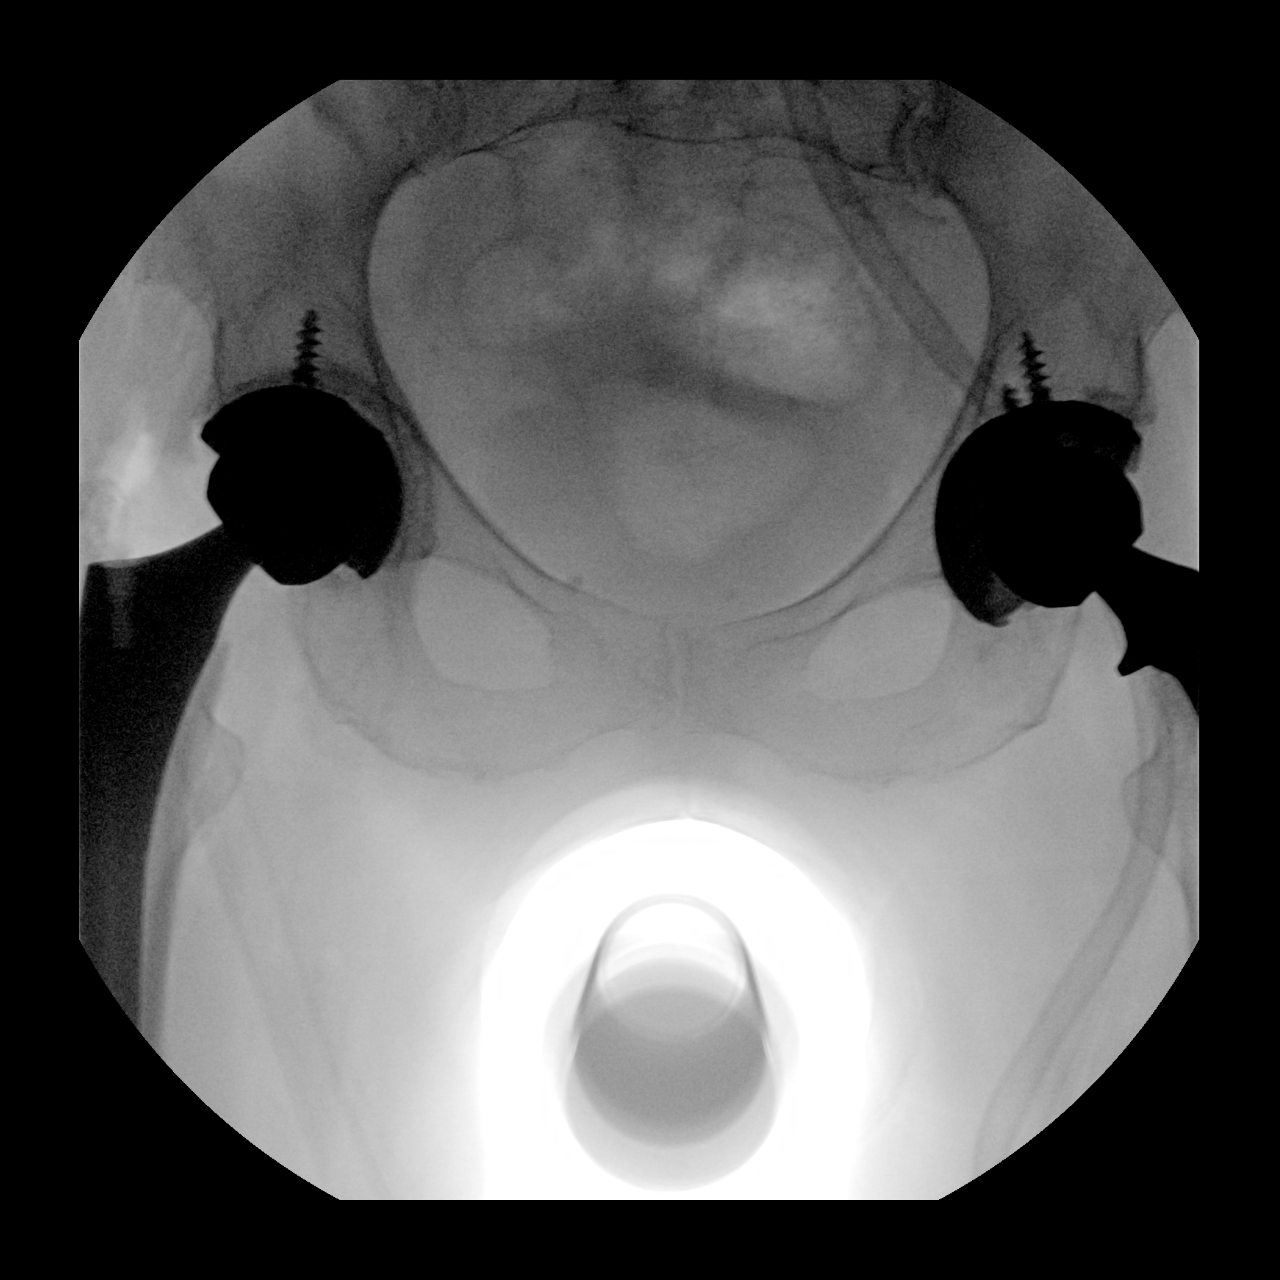

[3 of 3 positions shown; findings below may reference images not displayed]

FINDINGS: Three intraoperative fluoroscopic images were obtained of the right
hip. These demonstrate the right acetabular and femoral components
to be well situated.
IMPRESSION: Status post right total hip arthroplasty.

## 2021-05-31 NOTE — Progress Notes (Signed)
?Cardiology Office Note:   ? ?Date:  06/01/2021  ? ?ID:  [name], DOB 1933-04-06, MRN [id number] ? ?PCP:  Tomasa Hose, NP  ?Cardiologist:  Sinclair Grooms, MD  ? ?Referring MD: Tomasa Hose, NP  ? ?No chief complaint on file. ? ? ?History of Present Illness:   ? ?[name] is a 88 y.o. female with a hx of hyperlipidemia, primary hypertension, osteoarthritis, and PVC's. ? ?The interval since the last visit, she has had some episodes of discomfort in her chest that feels as though someone has laid a border across her chest and presses down.  It would last up to 15 to 20 minutes before resolving.  There is no radiation, diaphoresis, palpitation, or shortness of breath. ? ?She denies orthopnea, PND, and lower extremity swelling. ? ?Past Medical History:  ?Diagnosis Date  ? Anemia   ? hx -2013 blood transfusion  polyps  ? Arthritis   ? Coarse tremors   ? Bilateral Hands and arms  ? History of blood transfusion 2013  ? Hyperlipemia   ? Hypertension   ? Hypothyroidism   ? Nocturia   ? PONV (postoperative nausea and vomiting)   ? ? ?Past Surgical History:  ?Procedure Laterality Date  ? COLONOSCOPY    ? FRACTURE SURGERY    ? left leg  rod  ? JOINT REPLACEMENT Left   ? hip  ? KNEE ARTHROSCOPY Right   ? LUMBAR LAMINECTOMY/DECOMPRESSION MICRODISCECTOMY Right 04/10/2012  ? Procedure: LUMBAR LAMINECTOMY/DECOMPRESSION MICRODISCECTOMY 1 LEVEL;  Surgeon: Erline Levine, MD;  Location: [facility];  Service: Neurosurgery;  Laterality: Right;  Right Lumbar three - four Microdiskectomy  ? TOTAL HIP ARTHROPLASTY Left   ? TOTAL HIP ARTHROPLASTY Right 01/01/2019  ? Procedure: TOTAL HIP ARTHROPLASTY ANTERIOR APPROACH;  Surgeon: Renette Butters, MD;  Location: [facility];  Service: Orthopedics;  Laterality: Right;  ? TOTAL KNEE ARTHROPLASTY Right 09/24/2014  ? Procedure: TOTAL KNEE ARTHROPLASTY;  Surgeon: Ninetta Lights, MD;  Location: [facility];  Service: Orthopedics;  Laterality: Right;  ? ? ?Current  Medications: ?Current Meds  ?Medication Sig  ? amLODipine (NORVASC) 5 MG tablet Take 5 mg by mouth daily.  ? aspirin EC 81 MG tablet Take 81 mg by mouth daily.  ? cholecalciferol (VITAMIN D) 25 MCG (1000 UT) tablet Take 1,000 Units by mouth daily.  ? docusate sodium (COLACE) 100 MG capsule Take 100 mg by mouth 2 (two) times daily.  ? HYDROcodone-acetaminophen (NORCO/VICODIN) 5-325 MG tablet Take 1 tablet by mouth every 6 (six) hours as needed for moderate pain.  ? hydrOXYzine (ATARAX/VISTARIL) 10 MG tablet Take 10 mg by mouth as needed.  ? ipratropium (ATROVENT) 0.06 % nasal spray as needed.  ? levothyroxine (SYNTHROID, LEVOTHROID) 125 MCG tablet Take 125 mcg by mouth daily before breakfast.   ? losartan-hydrochlorothiazide (HYZAAR) 50-12.5 MG tablet Take 0.5 tablets by mouth daily.  ? Multiple Vitamin (MULTIVITAMIN WITH MINERALS) TABS tablet Take 1 tablet by mouth daily.  ? Omega-3 Fatty Acids (FISH OIL) 1200 MG CAPS Take 1,200 mg by mouth daily.   ? propranolol ER (INDERAL LA) 60 MG 24 hr capsule Take 1 capsule by mouth daily.  ? rosuvastatin (CRESTOR) 10 MG tablet Take 10 mg by mouth daily.  ?  ? ?Allergies:   Patient has no known allergies.  ? ?Social History  ? ?Socioeconomic History  ? Marital status: Married  ?  Spouse name: Not on file  ? Number of children: Not on  file  ? Years of education: Not on file  ? Highest education level: Not on file  ?Occupational History  ? Not on file  ?Tobacco Use  ? Smoking status: Never  ? Smokeless tobacco: Never  ?Vaping Use  ? Vaping Use: Never used  ?Substance and Sexual Activity  ? Alcohol use: No  ? Drug use: No  ? Sexual activity: Not on file  ?Other Topics Concern  ? Not on file  ?Social History Narrative  ? Not on file  ? ?Social Determinants of Health  ? ?Financial Resource Strain: Not on file  ?Food Insecurity: Not on file  ?Transportation Needs: Not on file  ?Physical Activity: Not on file  ?Stress: Not on file  ?Social Connections: Not on file  ?  ? ?Family  History: ?The patient's family history includes Stroke in her mother. ? ?ROS:   ?Please see the history of present illness.    ?She has a tremor.  Propranolol was started and metoprolol discontinued by her primary care physician with some success.  Strong family history of CVA and myocardial infarction.  All other systems reviewed and are negative. ? ?EKGs/Labs/Other Studies Reviewed:   ? ?The following studies were reviewed today: ?No new imaging ? ?EKG:  EKG sinus bradycardia 59 bpm.  Prominent voltage.  Background artifact.  When compared to December 05, 2018, nonspecific ST abnormality has resolved. ? ?Recent Labs: ?No results found for requested labs within last 8760 hours.  ?Recent Lipid Panel ?No results found for: CHOL, TRIG, HDL, CHOLHDL, VLDL, LDLCALC, LDLDIRECT ? ?Physical Exam:   ? ?VS:  BP (!) 142/82   Pulse (!) 59   Ht '5\' 8"'$  (1.727 m)   Wt 159 lb 3.2 oz (72.2 kg)   SpO2 97%   BMI 24.21 kg/m?    ? ?Wt Readings from Last 3 Encounters:  ?06/01/21 159 lb 3.2 oz (72.2 kg)  ?08/05/19 162 lb 9.6 oz (73.8 kg)  ?04/09/19 167 lb 1.9 oz (75.8 kg)  ?  ? ?GEN: Appears younger than the stated age. No acute distress ?HEENT: Normal ?NECK: No JVD. ?LYMPHATICS: No lymphadenopathy ?CARDIAC: No significant murmur. RRR no gallop, or edema. ?VASCULAR: Large ankles but no edema.  Normal Pulses. No bruits. ?RESPIRATORY:  Clear to auscultation without rales, wheezing or rhonchi  ?ABDOMEN: Soft, non-tender, non-distended, No pulsatile mass, ?MUSCULOSKELETAL: No deformity  ?SKIN: Warm and dry ?NEUROLOGIC:  Alert and oriented x 3 ?PSYCHIATRIC:  Normal affect  ? ?ASSESSMENT:   ? ?1. Chest pressure   ?2. HYPERCHOLESTEROLEMIA   ?3. Primary hypertension   ? ?PLAN:   ? ?In order of problems listed above: ? ?Nitroglycerin 0.4 mg sublingually as needed recurrent chest pain.  Return in 6 to 8 weeks to revisit this symptom.  If episodes become more frequent, may need to have ischemic evaluation.  She has an episode of this discomfort  once or twice a month for the last 6 months.  Continue aspirin. ?Continue Crestor 10 mg/day.  Most recent LDL cholesterol was 76.  Needs repeat testing. ?Blood pressure is reasonably well controlled on propranolol ER 60 mg/day, losartan HCT 50/12.5 mg/day, and amlodipine 5 mg/day. ? ? ?Overall education and awareness concerning secondary risk prevention was discussed in detail: LDL less than 70, hemoglobin A1c less than 7, blood pressure target less than 130/80 mmHg, >150 minutes of moderate aerobic activity per week, avoidance of smoking, weight control (via diet and exercise), and continued surveillance/management of/for obstructive sleep apnea. ? ? ? ?Medication Adjustments/Labs and  Tests Ordered: ?Current medicines are reviewed at length with the patient today.  Concerns regarding medicines are outlined above.  ?No orders of the defined types were placed in this encounter. ? ?No orders of the defined types were placed in this encounter. ? ? ?Patient Instructions  ?Medication Instructions:  ?Your physician has recommended you make the following change in your medication:  ? ?1) START Nitroglycerin 0.'4mg'$  as needed for chest pain/pressure (may take 3 doses spaced 5 minutes apart, if you are having to take the 3rd dose call 911) ? ?**Take this medication while sitting and allow 5-10 minutes for medication to work.** ? ?*If you need a refill on your cardiac medications before your next appointment, please call your pharmacy* ? ?Lab Work: ?NONE ? ?Testing/Procedures: ?NONE ? ?Follow-Up: ?At [facility], you and your health needs are our priority.  As part of our continuing mission to provide you with exceptional heart care, we have created designated Provider Care Teams.  These Care Teams include your primary Cardiologist (physician) and Advanced Practice Providers (APPs -  Physician Assistants and Nurse Practitioners) who all work together to provide you with the care you need, when you need it. ? ?Your next  appointment:   ?2 month(s) ? ?The format for your next appointment:   ?In Person ? ?Provider:   ?Sinclair Grooms, MD { ? ? ?Important Information About Sugar ? ? ? ? ?   ? ?Signed, ?Sinclair Grooms, MD  ?06/01/2021 3:

## 2021-06-01 ENCOUNTER — Ambulatory Visit: Payer: Medicare Other | Admitting: Interventional Cardiology

## 2021-06-01 ENCOUNTER — Encounter: Payer: Self-pay | Admitting: Interventional Cardiology

## 2021-06-01 VITALS — BP 142/82 | HR 59 | Ht 68.0 in | Wt 159.2 lb

## 2021-06-01 DIAGNOSIS — E7800 Pure hypercholesterolemia, unspecified: Secondary | ICD-10-CM

## 2021-06-01 DIAGNOSIS — R0789 Other chest pain: Secondary | ICD-10-CM

## 2021-06-01 DIAGNOSIS — I493 Ventricular premature depolarization: Secondary | ICD-10-CM

## 2021-06-01 DIAGNOSIS — I10 Essential (primary) hypertension: Secondary | ICD-10-CM | POA: Diagnosis not present

## 2021-06-01 MED ORDER — NITROGLYCERIN 0.4 MG SL SUBL: 0.4000 mg | SUBLINGUAL_TABLET | SUBLINGUAL | 6 refills | Status: DC | PRN

## 2021-06-01 NOTE — Patient Instructions (Signed)
Medication Instructions:  ?Your physician has recommended you make the following change in your medication:  ? ?1) START Nitroglycerin 0.'4mg'$  as needed for chest pain/pressure (may take 3 doses spaced 5 minutes apart, if you are having to take the 3rd dose call 911) ? ?**Take this medication while sitting and allow 5-10 minutes for medication to work.** ? ?*If you need a refill on your cardiac medications before your next appointment, please call your pharmacy* ? ?Lab Work: ?NONE ? ?Testing/Procedures: ?NONE ? ?Follow-Up: ?At [facility], you and your health needs are our priority.  As part of our continuing mission to provide you with exceptional heart care, we have created designated Provider Care Teams.  These Care Teams include your primary Cardiologist (physician) and Advanced Practice Providers (APPs -  Physician Assistants and Nurse Practitioners) who all work together to provide you with the care you need, when you need it. ? ?Your next appointment:   ?2 month(s) ? ?The format for your next appointment:   ?In Person ? ?Provider:   ?Sinclair Grooms, MD { ? ? ?Important Information About Sugar ? ? ? ? ?  ?

## 2021-06-08 ENCOUNTER — Ambulatory Visit: Payer: Medicare Other | Admitting: Interventional Cardiology

## 2021-08-12 NOTE — Progress Notes (Signed)
Cardiology Office Note:    Date:  08/13/2021   ID:  [name], DOB [birth year], MRN [id number]  PCP:  Tomasa Hose, NP  Cardiologist:  Sinclair Grooms, MD   Referring MD: Tomasa Hose, NP   Chief Complaint  Patient presents with   Hypertension   Hyperlipidemia    History of Present Illness:    [name] is a 88 y.o. female with a hx of of hyperlipidemia, primary hypertension, osteoarthritis, and PVC's.  6-8 week f/u of chest pressure and response to SL NTG.  She is doing well.  She denies angina.  Never had occasion to use nitroglycerin because the sensation of a "[place]" on her chest has not occurred.  She has been ambulatory.  Living independently.  Past Medical History:  Diagnosis Date   Anemia    hx -2013 blood transfusion  polyps   Arthritis    Coarse tremors    Bilateral Hands and arms   History of blood transfusion 2013   Hyperlipemia    Hypertension    Hypothyroidism    Nocturia    PONV (postoperative nausea and vomiting)     Past Surgical History:  Procedure Laterality Date   COLONOSCOPY     FRACTURE SURGERY     left leg  rod   JOINT REPLACEMENT Left    hip   KNEE ARTHROSCOPY Right    LUMBAR LAMINECTOMY/DECOMPRESSION MICRODISCECTOMY Right 04/10/2012   Procedure: LUMBAR LAMINECTOMY/DECOMPRESSION MICRODISCECTOMY 1 LEVEL;  Surgeon: Erline Levine, MD;  Location: [facility];  Service: Neurosurgery;  Laterality: Right;  Right Lumbar three - four Microdiskectomy   TOTAL HIP ARTHROPLASTY Left    TOTAL HIP ARTHROPLASTY Right 01/01/2019   Procedure: TOTAL HIP ARTHROPLASTY ANTERIOR APPROACH;  Surgeon: Renette Butters, MD;  Location: [facility];  Service: Orthopedics;  Laterality: Right;   TOTAL KNEE ARTHROPLASTY Right 09/24/2014   Procedure: TOTAL KNEE ARTHROPLASTY;  Surgeon: Ninetta Lights, MD;  Location: [facility];  Service: Orthopedics;  Laterality: Right;    Current Medications: Current Meds  Medication Sig   amLODipine  (NORVASC) 5 MG tablet Take 5 mg by mouth daily.   aspirin EC 81 MG tablet Take 81 mg by mouth daily.   cholecalciferol (VITAMIN D) 25 MCG (1000 UT) tablet Take 1,000 Units by mouth daily.   docusate sodium (COLACE) 100 MG capsule Take 100 mg by mouth 2 (two) times daily.   hydrOXYzine (ATARAX/VISTARIL) 10 MG tablet Take 10 mg by mouth as needed.   levothyroxine (SYNTHROID, LEVOTHROID) 125 MCG tablet Take 125 mcg by mouth daily before breakfast.    Multiple Vitamin (MULTIVITAMIN WITH MINERALS) TABS tablet Take 1 tablet by mouth daily.   Omega-3 Fatty Acids (FISH OIL) 1200 MG CAPS Take 1,200 mg by mouth daily.    propranolol ER (INDERAL LA) 60 MG 24 hr capsule Take 1 capsule by mouth daily.   rosuvastatin (CRESTOR) 10 MG tablet Take 10 mg by mouth daily.   [DISCONTINUED] losartan-hydrochlorothiazide (HYZAAR) 50-12.5 MG tablet Take 0.5 tablets by mouth daily.     Allergies:   Patient has no known allergies.   Social History   Socioeconomic History   Marital status: Married    Spouse name: Not on file   Number of children: Not on file   Years of education: Not on file   Highest education level: Not on file  Occupational History   Not on file  Tobacco Use   Smoking status: Never   Smokeless tobacco:  Never  Vaping Use   Vaping Use: Never used  Substance and Sexual Activity   Alcohol use: No   Drug use: No   Sexual activity: Not on file  Other Topics Concern   Not on file  Social History Narrative   Not on file   Social Determinants of Health   Financial Resource Strain: Not on file  Food Insecurity: Not on file  Transportation Needs: Not on file  Physical Activity: Not on file  Stress: Not on file  Social Connections: Not on file     Family History: The patient's family history includes Stroke in her mother.  ROS:   Please see the history of present illness.    Decreased memory.  We discussed establishing with a new cardiologist on the next visit.  She states 2 of her  doctors have retired this year.  All other systems reviewed and are negative.  EKGs/Labs/Other Studies Reviewed:    The following studies were reviewed today: No new imaging  EKG:  EKG not repeated  Recent Labs: No results found for requested labs within last 365 days.  Recent Lipid Panel No results found for: "CHOL", "TRIG", "HDL", "CHOLHDL", "VLDL", "LDLCALC", "LDLDIRECT"  Physical Exam:    VS:  BP 120/70   Pulse 70   Ht '5\' 8"'$  (1.727 m)   Wt 160 lb 12.8 oz (72.9 kg)   SpO2 96%   BMI 24.45 kg/m     Wt Readings from Last 3 Encounters:  08/13/21 160 lb 12.8 oz (72.9 kg)  06/01/21 159 lb 3.2 oz (72.2 kg)  08/05/19 162 lb 9.6 oz (73.8 kg)     GEN: Appears younger than the stated age. No acute distress HEENT: Normal NECK: No JVD. LYMPHATICS: No lymphadenopathy CARDIAC: No murmur. RRR no gallop, or edema. VASCULAR:  Normal Pulses. No bruits. RESPIRATORY:  Clear to auscultation without rales, wheezing or rhonchi  ABDOMEN: Soft, non-tender, non-distended, No pulsatile mass, MUSCULOSKELETAL: No deformity  SKIN: Warm and dry NEUROLOGIC:  Alert and oriented x 3 PSYCHIATRIC:  Normal affect   ASSESSMENT:    1. Chest pressure   2. Primary hypertension   3. HYPERCHOLESTEROLEMIA    PLAN:    In order of problems listed above:  She has not had occasion to use sublingual nitroglycerin.  Her daughter is with her and states that these episodes have occurred off and on over the years and do not last longer than 5 minutes.  She has felt that they have been indigestion related.  In the meantime, she has had no episode in the last 1-1/2 to 2 months and nitro has not been tried. Continue to monitor but is doing well and need to decide if perhaps a reduction in medication intensity is needed.  Consider decreasing intensity of amlodipine or Hyzaar or Inderal LA.  Target blood pressure 140/80 mmHg. Continue rosuvastatin.   Medication Adjustments/Labs and Tests Ordered: Current  medicines are reviewed at length with the patient today.  Concerns regarding medicines are outlined above.  No orders of the defined types were placed in this encounter.  Meds ordered this encounter  Medications   losartan-hydrochlorothiazide (HYZAAR) 50-12.5 MG tablet    Sig: Take 0.5 tablets by mouth daily.    Dispense:  45 tablet    Refill:  3    Patient Instructions  Medication Instructions:  Your physician recommends that you continue on your current medications as directed. Please refer to the Current Medication list given to you today.  *If you  need a refill on your cardiac medications before your next appointment, please call your pharmacy*  Lab Work: NONE  Testing/Procedures: NONE  Follow-Up: At [facility], you and your health needs are our priority.  As part of our continuing mission to provide you with exceptional heart care, we have created designated Provider Care Teams.  These Care Teams include your primary Cardiologist (physician) and Advanced Practice Providers (APPs -  Physician Assistants and Nurse Practitioners) who all work together to provide you with the care you need, when you need it.  Your next appointment:   9-12 month(s)  The format for your next appointment:   In Person  Provider:   Sinclair Grooms, MD {   Important Information About Sugar         Signed, Sinclair Grooms, MD  08/13/2021 10:17 AM    [place]

## 2021-08-13 ENCOUNTER — Encounter: Payer: Self-pay | Admitting: Interventional Cardiology

## 2021-08-13 ENCOUNTER — Ambulatory Visit: Payer: Medicare Other | Admitting: Interventional Cardiology

## 2021-08-13 VITALS — BP 120/70 | HR 70 | Ht 68.0 in | Wt 160.8 lb

## 2021-08-13 DIAGNOSIS — E7800 Pure hypercholesterolemia, unspecified: Secondary | ICD-10-CM

## 2021-08-13 DIAGNOSIS — R0789 Other chest pain: Secondary | ICD-10-CM

## 2021-08-13 DIAGNOSIS — I10 Essential (primary) hypertension: Secondary | ICD-10-CM | POA: Diagnosis not present

## 2021-08-13 MED ORDER — LOSARTAN POTASSIUM-HCTZ 50-12.5 MG PO TABS: 0.5000 | ORAL_TABLET | Freq: Every day | ORAL | 3 refills | Status: AC

## 2021-08-13 NOTE — Patient Instructions (Signed)
Medication Instructions:  ?Your physician recommends that you continue on your current medications as directed. Please refer to the Current Medication list given to you today. ? ?*If you need a refill on your cardiac medications before your next appointment, please call your pharmacy* ? ?Lab Work: ?NONE ? ?Testing/Procedures: ?NONE ? ?Follow-Up: ?At [facility], you and your health needs are our priority.  As part of our continuing mission to provide you with exceptional heart care, we have created designated Provider Care Teams.  These Care Teams include your primary Cardiologist (physician) and Advanced Practice Providers (APPs -  Physician Assistants and Nurse Practitioners) who all work together to provide you with the care you need, when you need it. ? ?Your next appointment:   ?9-12 month(s) ? ?The format for your next appointment:   ?In Person ? ?Provider:   ?Henry W Smith III, MD { ? ? ? ?Important Information About Sugar ? ? ? ? ?  ?

## 2021-11-15 ENCOUNTER — Other Ambulatory Visit: Payer: Self-pay | Admitting: Nurse Practitioner

## 2021-11-15 DIAGNOSIS — E2839 Other primary ovarian failure: Secondary | ICD-10-CM

## 2022-05-05 ENCOUNTER — Ambulatory Visit
Admission: RE | Admit: 2022-05-05 | Discharge: 2022-05-05 | Disposition: A | Discharge status: Home / Self Care | Payer: Medicare Other | Source: Ambulatory Visit | Attending: Nurse Practitioner | Admitting: Nurse Practitioner

## 2022-05-05 DIAGNOSIS — E2839 Other primary ovarian failure: Secondary | ICD-10-CM

## 2023-07-12 ENCOUNTER — Emergency Department (HOSPITAL_BASED_OUTPATIENT_CLINIC_OR_DEPARTMENT_OTHER)

## 2023-07-12 ENCOUNTER — Other Ambulatory Visit: Payer: Self-pay

## 2023-07-12 ENCOUNTER — Emergency Department (HOSPITAL_BASED_OUTPATIENT_CLINIC_OR_DEPARTMENT_OTHER)
Admission: EM | Admit: 2023-07-12 | Discharge: 2023-07-12 | Disposition: A | Discharge status: Home / Self Care | Attending: Emergency Medicine | Admitting: Emergency Medicine

## 2023-07-12 ENCOUNTER — Encounter (HOSPITAL_BASED_OUTPATIENT_CLINIC_OR_DEPARTMENT_OTHER): Payer: Self-pay | Admitting: Emergency Medicine

## 2023-07-12 DIAGNOSIS — R001 Bradycardia, unspecified: Secondary | ICD-10-CM | POA: Insufficient documentation

## 2023-07-12 DIAGNOSIS — Z7982 Long term (current) use of aspirin: Secondary | ICD-10-CM | POA: Insufficient documentation

## 2023-07-12 DIAGNOSIS — K5641 Fecal impaction: Secondary | ICD-10-CM | POA: Insufficient documentation

## 2023-07-12 DIAGNOSIS — R600 Localized edema: Secondary | ICD-10-CM | POA: Insufficient documentation

## 2023-07-12 DIAGNOSIS — I10 Essential (primary) hypertension: Secondary | ICD-10-CM | POA: Insufficient documentation

## 2023-07-12 DIAGNOSIS — Z79899 Other long term (current) drug therapy: Secondary | ICD-10-CM | POA: Diagnosis not present

## 2023-07-12 DIAGNOSIS — R109 Unspecified abdominal pain: Secondary | ICD-10-CM | POA: Diagnosis present

## 2023-07-12 LAB — COMPREHENSIVE METABOLIC PANEL WITH GFR
ALT: 28 U/L (ref 0–44)
AST: 39 U/L (ref 15–41)
Albumin: 4 g/dL (ref 3.5–5.0)
Alkaline Phosphatase: 55 U/L (ref 38–126)
Anion gap: 10 (ref 5–15)
BUN: 15 mg/dL (ref 8–23)
CO2: 27 mmol/L (ref 22–32)
Calcium: 10 mg/dL (ref 8.9–10.3)
Chloride: 104 mmol/L (ref 98–111)
Creatinine, Ser: 0.92 mg/dL (ref 0.44–1.00)
GFR, Estimated: 59 mL/min — ABNORMAL LOW (ref >60)
Glucose, Bld: 93 mg/dL (ref 70–99)
Potassium: 3.8 mmol/L (ref 3.5–5.1)
Sodium: 140 mmol/L (ref 135–145)
Total Bilirubin: 0.7 mg/dL (ref 0.0–1.2)
Total Protein: 6.5 g/dL (ref 6.5–8.1)

## 2023-07-12 LAB — CBC WITH DIFFERENTIAL/PLATELET
Abs Immature Granulocytes: 0.02 10*3/uL (ref 0.00–0.07)
Basophils Absolute: 0.1 10*3/uL (ref 0.0–0.1)
Basophils Relative: 1 %
Eosinophils Absolute: 0.1 10*3/uL (ref 0.0–0.5)
Eosinophils Relative: 2 %
HCT: 36.3 % (ref 36.0–46.0)
Hemoglobin: 12.3 g/dL (ref 12.0–15.0)
Immature Granulocytes: 0 %
Lymphocytes Relative: 31 %
Lymphs Abs: 1.9 10*3/uL (ref 0.7–4.0)
MCH: 32.9 pg (ref 26.0–34.0)
MCHC: 33.9 g/dL (ref 30.0–36.0)
MCV: 97.1 fL (ref 80.0–100.0)
Monocytes Absolute: 0.7 10*3/uL (ref 0.1–1.0)
Monocytes Relative: 11 %
Neutro Abs: 3.3 10*3/uL (ref 1.7–7.7)
Neutrophils Relative %: 55 %
Platelets: 182 10*3/uL (ref 150–400)
RBC: 3.74 MIL/uL — ABNORMAL LOW (ref 3.87–5.11)
RDW: 13.2 % (ref 11.5–15.5)
WBC: 6 10*3/uL (ref 4.0–10.5)
nRBC: 0 % (ref 0.0–0.2)

## 2023-07-12 LAB — URINALYSIS, ROUTINE W REFLEX MICROSCOPIC
Bilirubin Urine: NEGATIVE
Glucose, UA: NEGATIVE mg/dL
Hgb urine dipstick: NEGATIVE
Ketones, ur: NEGATIVE mg/dL
Leukocytes,Ua: NEGATIVE
Nitrite: NEGATIVE
Protein, ur: NEGATIVE mg/dL
Specific Gravity, Urine: 1.006 (ref 1.005–1.030)
pH: 7.5 (ref 5.0–8.0)

## 2023-07-12 LAB — PRO BRAIN NATRIURETIC PEPTIDE: Pro Brain Natriuretic Peptide: 474 pg/mL — ABNORMAL HIGH (ref <300.0)

## 2023-07-12 MED ORDER — IOHEXOL 300 MG/ML  SOLN
100.0000 mL | Freq: Once | INTRAMUSCULAR | Status: AC | PRN
  Administered 2023-07-12: 85 mL via INTRAVENOUS

## 2023-07-12 MED ORDER — SMOG ENEMA: 960.0000 mL | Freq: Once | RECTAL | Status: DC

## 2023-07-12 NOTE — ED Notes (Signed)
 Given soap suds enema.  Had multiple large bowel movements.

## 2023-07-12 NOTE — ED Provider Notes (Addendum)
 [place] EMERGENCY DEPARTMENT AT [facility] Provider Note   CSN: [id number] Arrival date & time: 07/12/23  9177     Patient presents with: Abdominal Pain   [name] is a 89 y.o. female with history of constipation, hypertension, high lipidemia, cognitive decline, tremors presents emerged part today for evaluation of 2 weeks of constipation.  Patient's had some leakage for the past 2 weeks and is only been able to have very small bowel movements every few days.  Patient usually takes a [place]' laxative daily if not twice daily, but has not taken this in the past 2 weeks as she was out and family bought her a stool softener instead.  She does have a history of hemorrhoids and her daughter thought that maybe she was complaining of hemorrhoids that she would planing of some rectal pressure that feels worse with standing.  Has not had any difficulty urinating.  Felt nausea 1 time a few days ago but has not had any during this time..  No vomiting.  Still passing some gas.  No history of impaction.  No known drug allergies.  No fevers or dysuria reported.  Daughter also reports that her bilateral lower legs have been more swollen however she does not wear compression socks and spends a lot of time in her recliner.  Abdominal Pain Associated symptoms: constipation   Associated symptoms: no chest pain, no chills, no dysuria, no fever, no hematuria, no shortness of breath and no vomiting        Prior to Admission medications   Medication Sig Start Date End Date Taking? Authorizing Provider  levothyroxine  (SYNTHROID ) 112 MCG tablet Take 112 mcg by mouth daily. *6am* 04/26/23  Yes [provider]  amLODipine  (NORVASC ) 5 MG tablet Take 5 mg by mouth daily. 05/20/19   [provider]  aspirin  EC 81 MG tablet Take 81 mg by mouth daily.    [provider]  cholecalciferol (VITAMIN D) 25 MCG (1000 UT) tablet Take 1,000 Units by mouth daily.    [provider]  docusate sodium  (COLACE) 100 MG capsule Take 100 mg by mouth 2 (two) times daily.    [provider]  hydrOXYzine (ATARAX/VISTARIL) 10 MG tablet Take 10 mg by mouth as needed.    [provider]  levothyroxine  (SYNTHROID , LEVOTHROID) 125 MCG tablet Take 125 mcg by mouth daily before breakfast.     [provider]  losartan -hydrochlorothiazide  (HYZAAR) 50-12.5 MG tablet Take 0.5 tablets by mouth daily. 08/13/21   Claudene Victory ORN, MD  Multiple Vitamin (MULTIVITAMIN WITH MINERALS) TABS tablet Take 1 tablet by mouth daily.    [provider]  Omega-3 Fatty Acids (FISH OIL) 1200 MG CAPS Take 1,200 mg by mouth daily.     [provider]  propranolol ER (INDERAL LA) 60 MG 24 hr capsule Take 1 capsule by mouth daily. 03/30/20   [provider]  rosuvastatin  (CRESTOR ) 10 MG tablet Take 10 mg by mouth daily.    [provider]    Allergies: Patient has no known allergies.    Review of Systems  Constitutional:  Negative for chills and fever.  Respiratory:  Negative for shortness of breath.   Cardiovascular:  Positive for leg swelling. Negative for chest pain.  Gastrointestinal:  Positive for abdominal pain, constipation and rectal pain. Negative for anal bleeding, blood in stool and vomiting.  Genitourinary:  Negative for difficulty urinating, dysuria and hematuria.    Updated Vital Signs BP (!) 168/76 (  BP Location: Right Arm)   Pulse (!) 58   Temp 97.9 F (36.6 C) (Oral)   Resp 16   SpO2 98%   Physical Exam Vitals and nursing note reviewed.  Constitutional:      General: She is not in acute distress.    Appearance: She is not ill-appearing or toxic-appearing.   Eyes:     General: No scleral icterus.   Cardiovascular:     Rate and Rhythm: Bradycardia present.     Comments: Trace/1+ pitting edema to the mid calf bilaterally.  Legs appear and feel symmetric in coloration, temperature, and size. Pulmonary:      Effort: Pulmonary effort is normal. No respiratory distress.  Abdominal:     General: Bowel sounds are increased.     Palpations: Abdomen is soft.     Tenderness: There is no abdominal tenderness. There is no guarding or rebound.     Comments: Abdomen is mildly distended but soft.  No abdominal tenderness palpation.  Increased bowel sounds.   Skin:    General: Skin is warm and dry.   Neurological:     Mental Status: She is alert.     (all labs ordered are listed, but only abnormal results are displayed) Labs Reviewed  CBC WITH DIFFERENTIAL/PLATELET - Abnormal; Notable for the following components:      Result Value   RBC 3.74 (*)    All other components within normal limits  COMPREHENSIVE METABOLIC PANEL WITH GFR - Abnormal; Notable for the following components:   GFR, Estimated 59 (*)    All other components within normal limits  PRO BRAIN NATRIURETIC PEPTIDE - Abnormal; Notable for the following components:   Pro Brain Natriuretic Peptide 474.0 (*)    All other components within normal limits  URINALYSIS, ROUTINE W REFLEX MICROSCOPIC  URINALYSIS, ROUTINE W REFLEX MICROSCOPIC    EKG: None  Radiology: US  Venous Img Lower Unilateral Right Result Date: 07/12/2023 CLINICAL DATA:  Possible right femoral DVT seen on CT EXAM: RIGHT LOWER EXTREMITY VENOUS DOPPLER ULTRASOUND TECHNIQUE: Gray-scale sonography with compression, as well as color and duplex ultrasound, were performed to evaluate the deep venous system(s) from the level of the common femoral vein through the popliteal and proximal calf veins. COMPARISON:  CT earlier today FINDINGS: VENOUS Normal compressibility of the common femoral, superficial femoral, and popliteal veins, as well as the visualized calf veins. Visualized portions of profunda femoral vein and great saphenous vein unremarkable. No filling defects to suggest DVT on grayscale or color Doppler imaging. Doppler waveforms show normal direction of venous flow, normal  respiratory plasticity and response to augmentation. Limited views of the contralateral common femoral vein are unremarkable. OTHER None. Limitations: none IMPRESSION: No evidence of right lower extremity DVT. In particular, no DVT seen in the right femoral vein as questioned on prior CT. Electronically Signed   By: Franky Crease M.D.   On: 07/12/2023 16:02   CT ABDOMEN PELVIS W CONTRAST Result Date: 07/12/2023 CLINICAL DATA:  Concern for bowel obstruction. EXAM: CT ABDOMEN AND PELVIS WITH CONTRAST TECHNIQUE: Multidetector CT imaging of the abdomen and pelvis was performed using the standard protocol following bolus administration of intravenous contrast. RADIATION DOSE REDUCTION: This exam was performed according to the departmental dose-optimization program which includes automated exposure control, adjustment of the mA and/or kV according to patient size and/or use of iterative reconstruction technique. CONTRAST:  85mL OMNIPAQUE  IOHEXOL  300 MG/ML  SOLN COMPARISON:  CT dated 04/01/2019. FINDINGS: Lower chest: The visualized lung bases are  clear. No intra-abdominal free air or free fluid. Hepatobiliary: The liver is unremarkable no biliary dilatation. The gallbladder is unremarkable. Pancreas: Unremarkable. No pancreatic ductal dilatation or surrounding inflammatory changes. Spleen: Normal in size without focal abnormality. Adrenals/Urinary Tract: The adrenal glands unremarkable. Mild fullness of the renal collecting systems bilaterally. There is symmetric enhancement and excretion of contrast by both kidneys. The urinary bladder is grossly unremarkable. Stomach/Bowel: There is sigmoid diverticulosis. There is no bowel obstruction or active inflammation. The appendix is not visualized with certainty. No inflammatory changes identified in the right lower quadrant. Vascular/Lymphatic: Mild aortoiliac atherosclerotic disease. The IVC is unremarkable. Apparent low attenuating content in the right femoral vein may be  artifactual. DVT is not excluded. Further evaluation with duplex ultrasound recommended. No portal venous gas. There is no adenopathy. Reproductive: The uterus is grossly unremarkable. No suspicious adnexal masses. Other: None Musculoskeletal: Osteopenia with degenerative changes spine. No acute osseous pathology. Total bilateral hip arthroplasties. IMPRESSION: 1. No acute intra-abdominal or pelvic pathology. 2. Sigmoid diverticulosis. No bowel obstruction. 3. Artifact versus possible right femoral DVT. Further evaluation with duplex ultrasound recommended. 4.  Aortic Atherosclerosis (ICD10-I70.0). Electronically Signed   By: Vanetta Chou M.D.   On: 07/12/2023 13:27   DG Chest Portable 1 View Result Date: 07/12/2023 CLINICAL DATA:  Lower leg edema, lower abdominal pain. EXAM: PORTABLE CHEST 1 VIEW COMPARISON:  03/24/2019. FINDINGS: Trachea is midline. Heart is enlarged, stable. Biapical pleural thickening. Lungs are mildly hyperinflated but clear. No pleural fluid. IMPRESSION: Mild hyperinflation without acute finding. Electronically Signed   By: Newell Eke M.D.   On: 07/12/2023 12:37    .Fecal disimpaction  Date/Time: 07/12/2023 4:55 PM  Performed by: Bernis Ernst, PA-C Authorized by: Bernis Ernst, PA-C  Consent: Verbal consent obtained Risks and benefits: risks, benefits and alternatives were discussed Consent given by: patient Patient understanding: patient states understanding of the procedure being performed Patient identity confirmed: verbally with patient Comments: Large, soft stool ball present in the rectal vault. Was able to break it up using my finger and remove a small amount, will continue with enema. No bleeding.       Medications Ordered in the ED  iohexol  (OMNIPAQUE ) 300 MG/ML solution 100 mL (85 mLs Intravenous Contrast Given 07/12/23 1254)    Medical Decision Making Amount and/or Complexity of Data Reviewed Labs: ordered. Radiology:  ordered.  Risk Prescription drug management.   89 y.o. female presents to the ER for evaluation of constipation, rectal pain. Differential diagnosis includes but is not limited to small bowel obstruction, fecal impaction, constipation, stercoral colitis. Vital signs blood pressure 165/73, pulse rate of 55 otherwise unremarkable. Physical exam as noted above.   Story sounds more consistent with fecal impaction however given age will order CT abdomen pelvis as well as labs.  She does not have any chest pain or shortness of breath and is satting well on room air without increased work of breathing.  I think she is likely having some dependent edema from sitting in her chair.  She does have some mild trace edema.  Will obtain BNP and chest x-ray.  I independently reviewed and interpreted the patient's labs.  Urinalysis unremarkable.  CMP shows no electrolyte or LFT abnormality.  CBC without leukocytosis or anemia.  proBNP slightly elevated at 474.  Chest x-ray shows Mild hyperinflation without acute finding.   CT abdomen pelvis shows 1. No acute intra-abdominal or pelvic pathology. 2. Sigmoid diverticulosis. No bowel obstruction. 3. Artifact versus possible right femoral DVT. Further evaluation  with duplex ultrasound recommended. 4.  Aortic Atherosclerosis. Per radiologist's interpretation.    I think this is likely artifact however will order DVT scan to rule out blood clot possibly seen on CT imaging  DVT study shows No evidence of right lower extremity DVT. In particular, no DVT seen in the right femoral vein as questioned on prior CT. Per radiologist's interpretation.    Fecal disimpaction performed.  Patient had softer large stool ball present.  Was able to break up some and remove some.  Will order enema.  Soapsuds enema ordered.  Patient had multiple large bowel movements.  She reports that she feels much better and is not having any rectal pressure.  Discussed with the patient against using  laxatives.  Recommended doing MiraLAX  for softening stool.  Will give her follow-up with a GI doctor.  Think she likely has bilateral leg swelling from dependent edema.  Recommended compression stockings.  Doubt CHF. She is stable for discharge home with close outpatient follow-up.  We discussed the results of the labs/imaging. The plan is bowel regimen, hydration, follow-up with GI outpatient. We discussed strict return precautions and red flag symptoms. The patient verbalized their understanding and agrees to the plan. The patient is stable and being discharged home in good condition.  I discussed this case with my attending physician who cosigned this note including patient's presenting symptoms, physical exam, and planned diagnostics and interventions. Attending physician stated agreement with plan or made changes to plan which were implemented.   Portions of this report may have been transcribed using voice recognition software. Every effort was made to ensure accuracy; however, inadvertent computerized transcription errors may be present.    Final diagnoses:  Fecal impaction [facility])  Bilateral leg edema    ED Discharge Orders     None          Bernis Ernst, [place] 07/12/23 1653    Bernis Ernst, PA-C 07/12/23 1656    Bernis Ernst, PA-C 07/12/23 1657    Tegeler, Lonni PARAS, MD 07/15/23 [phone]

## 2023-07-12 NOTE — Discharge Instructions (Addendum)
 You were seen in the ER today for evaluation of your constipation.  In got that you are feeling better.  You did not not need to take a laxative twice daily as this could be dangerous especially in your age range.  You can try MiraLAX  1-2 scoops in the morning as a stool softener.  This is safe to take daily.  I have included information for a GI provider into the discharge paperwork for you to call to schedule an appointment with as you may be put on a different regimen.  Elevating your legs are swollen from some dependent edema.  Is important that she make sure you are elevating your legs you can also wear compression stockings as well.  I am included information on fecal impaction into the discharge paperwork for you to review.  If you have any concerns with new or worsening symptoms, please return to your nearest emergency department for reevaluation.  Contact a health care provider if you: Have ongoing pain in your rectum. Need to use an enema or a suppository more than 2 times a week. Have rectal bleeding. Continue to have problems. The problems may include not being able to go to the bathroom and having long-term constipation. Have pain in your abdomen. Have thin, pencil-like stools. Get help right away if: You have black or tarry stools.

## 2023-07-12 NOTE — ED Triage Notes (Signed)
 C/o lower abd pain x 1 week. With stool leakage. Recent constipation that was treated with otc meds.
